# Patient Record
Sex: Female | Born: 1985
Health system: Southern US, Community
[De-identification: ages and names within clinical notes are randomized; demographics above are authoritative.]

## PROBLEM LIST (undated history)

## (undated) ENCOUNTER — Inpatient Hospital Stay (HOSPITAL_COMMUNITY): Payer: Self-pay

## (undated) DIAGNOSIS — Z789 Other specified health status: Secondary | ICD-10-CM

## (undated) DIAGNOSIS — IMO0002 Reserved for concepts with insufficient information to code with codable children: Secondary | ICD-10-CM

## (undated) DIAGNOSIS — Z87448 Personal history of other diseases of urinary system: Secondary | ICD-10-CM

## (undated) DIAGNOSIS — R87629 Unspecified abnormal cytological findings in specimens from vagina: Secondary | ICD-10-CM

## (undated) DIAGNOSIS — R87619 Unspecified abnormal cytological findings in specimens from cervix uteri: Secondary | ICD-10-CM

## (undated) DIAGNOSIS — Z8619 Personal history of other infectious and parasitic diseases: Secondary | ICD-10-CM

## (undated) DIAGNOSIS — D649 Anemia, unspecified: Secondary | ICD-10-CM

## (undated) HISTORY — DX: Personal history of other infectious and parasitic diseases: Z86.19

## (undated) HISTORY — DX: Anemia, unspecified: D64.9

## (undated) HISTORY — PX: NO PAST SURGERIES: SHX2092

## (undated) HISTORY — DX: Personal history of other diseases of urinary system: Z87.448

---

## 2010-12-04 DIAGNOSIS — D649 Anemia, unspecified: Secondary | ICD-10-CM

## 2010-12-04 HISTORY — DX: Anemia, unspecified: D64.9

## 2012-04-22 ENCOUNTER — Inpatient Hospital Stay (HOSPITAL_COMMUNITY)
Admission: AD | Admit: 2012-04-22 | Discharge: 2012-04-22 | Disposition: A | Discharge status: Home / Self Care | Payer: BC Managed Care – PPO | Source: Ambulatory Visit | Attending: Obstetrics and Gynecology | Admitting: Obstetrics and Gynecology

## 2012-04-22 ENCOUNTER — Inpatient Hospital Stay (HOSPITAL_COMMUNITY): Payer: BC Managed Care – PPO

## 2012-04-22 ENCOUNTER — Encounter (HOSPITAL_COMMUNITY): Payer: Self-pay | Admitting: *Deleted

## 2012-04-22 DIAGNOSIS — O200 Threatened abortion: Secondary | ICD-10-CM | POA: Insufficient documentation

## 2012-04-22 HISTORY — DX: Other specified health status: Z78.9

## 2012-04-22 LAB — CBC
MCH: 30.1 pg (ref 26.0–34.0)
Platelets: 196 10*3/uL (ref 150–400)
RBC: 4.52 MIL/uL (ref 3.87–5.11)
WBC: 8.8 10*3/uL (ref 4.0–10.5)

## 2012-04-22 LAB — ABO/RH: ABO/RH(D): O POS

## 2012-04-22 NOTE — MAU Note (Signed)
Pt had U/S on Friday told she was either only [redacted] weeks pregnant (supposed to be 7 weeks) or that  She may be miscarring or have an ectopic pregnancy. Pt stated she started spotting yesterday and the last night had heavier bleeding like a period passed some clots mild cramping reported.

## 2012-04-22 NOTE — MAU Provider Note (Signed)
History     CSN: [id number]  Arrival date and time: 04/22/12 1123   First Provider Initiated Contact with Patient 04/22/12 1234      Chief Complaint  Patient presents with  . Vaginal Bleeding   HPI  Pt is early pregnant ?5-7 weeks presenting with vaginal bleeding.  Pt denies pain.  Pt was seen on Friday 1/17 at Dr. Lisbeth Ply office and told that ultrasound showed pregnancy less than expected- she was measuring ~ 5 weeks with hx of long cycles.  Pt started bleeding last night like a period and passed some small clots.  Pt denies any pain or other complaints, except feeling dizzy and light headed.  Pt is to have repeat HCG in the office tomorrow  Past Medical History  Diagnosis Date  . No pertinent past medical history     Past Surgical History  Procedure Date  . No past surgeries     No family history on file.  History  Substance Use Topics  . Smoking status: Never Smoker   . Smokeless tobacco: Not on file  . Alcohol Use: Yes     Comment: before pregnancy    Allergies: No Known Allergies  Prescriptions prior to admission  Medication Sig Dispense Refill  . Prenatal Vit-Fe Fumarate-FA (PRENATAL MULTIVITAMIN) TABS Take 1 tablet by mouth daily.        Review of Systems  Constitutional: Negative for fever and chills.  Gastrointestinal: Negative for nausea, vomiting, abdominal pain, diarrhea and constipation.  Genitourinary: Negative for dysuria.  Skin: Negative for rash.  Neurological: Positive for dizziness. Negative for headaches.   Physical Exam   Blood pressure 115/69, pulse 80, temperature 98.1 F (36.7 C), temperature source Oral, resp. rate 18, height 5\' 1"  (1.549 m), weight 114 lb (51.71 kg), last menstrual period 02/25/2012.  Physical Exam  Vitals reviewed. Constitutional: She is oriented to person, place, and time. She appears well-developed and well-nourished.  HENT:  Head: Normocephalic.  Eyes: Pupils are equal, round, and reactive to light.  Neck:  Normal range of motion. Neck supple.  Cardiovascular: Normal rate.   Respiratory: Effort normal.  GI: Soft. She exhibits no distension. There is no tenderness. There is no rebound.  Musculoskeletal: Normal range of motion.  Neurological: She is alert and oriented to person, place, and time.  Skin: Skin is warm and dry.  Psychiatric: She has a normal mood and affect.    MAU Course  Procedures Results for orders placed during the hospital encounter of 04/22/12 (from the past 24 hour(s))  CBC     Status: Normal   Collection Time   04/22/12 11:35 AM      Component Value Range   WBC 8.8  4.0 - 10.5 K/uL   RBC 4.52  3.87 - 5.11 MIL/uL   Hemoglobin 13.6  12.0 - 15.0 g/dL   HCT 40.9  81.1 - 91.4 %   MCV 90.0  78.0 - 100.0 fL   MCH 30.1  26.0 - 34.0 pg   MCHC 33.4  30.0 - 36.0 g/dL   RDW 78.2  95.6 - 21.3 %   Platelets 196  150 - 400 K/uL  ABO/RH     Status: Normal   Collection Time   04/22/12 11:35 AM      Component Value Range   ABO/RH(D) O POS    HCG, QUANTITATIVE, PREGNANCY     Status: Abnormal   Collection Time   04/22/12 11:35 AM      Component Value Range  hCG, Beta Chain, Quant, S 624 (*) <5 mIU/mL     Discussed with Dr. Marcelle Overlie- probable miscarriage- will f/u in the office tomorrow to see Dr. Arelia Sneddon Discussed with pt and husband threatened miscarriage Assessment and Plan  Bleeding in pregnancy Threatened miscarriage  Dalbert Stillings 04/22/2012, 1:39 PM

## 2012-08-15 LAB — OB RESULTS CONSOLE ABO/RH

## 2012-08-15 LAB — OB RESULTS CONSOLE GC/CHLAMYDIA: Gonorrhea: NEGATIVE

## 2012-08-15 LAB — OB RESULTS CONSOLE ANTIBODY SCREEN: Antibody Screen: NEGATIVE

## 2013-02-07 LAB — OB RESULTS CONSOLE GBS: GBS: NEGATIVE

## 2013-02-13 ENCOUNTER — Encounter (HOSPITAL_COMMUNITY): Payer: Self-pay | Admitting: *Deleted

## 2013-02-13 ENCOUNTER — Telehealth (HOSPITAL_COMMUNITY): Payer: Self-pay | Admitting: *Deleted

## 2013-02-13 ENCOUNTER — Inpatient Hospital Stay (HOSPITAL_COMMUNITY)
Admission: AD | Admit: 2013-02-13 | Discharge: 2013-02-13 | Disposition: A | Discharge status: Home / Self Care | Payer: BC Managed Care – PPO | Source: Ambulatory Visit | Attending: Obstetrics and Gynecology | Admitting: Obstetrics and Gynecology

## 2013-02-13 DIAGNOSIS — O321XX0 Maternal care for breech presentation, not applicable or unspecified: Secondary | ICD-10-CM | POA: Insufficient documentation

## 2013-02-13 DIAGNOSIS — N949 Unspecified condition associated with female genital organs and menstrual cycle: Secondary | ICD-10-CM | POA: Insufficient documentation

## 2013-02-13 DIAGNOSIS — O99891 Other specified diseases and conditions complicating pregnancy: Secondary | ICD-10-CM | POA: Insufficient documentation

## 2013-02-13 HISTORY — DX: Other specified health status: Z78.9

## 2013-02-13 LAB — URINALYSIS, ROUTINE W REFLEX MICROSCOPIC
Bilirubin Urine: NEGATIVE
Protein, ur: NEGATIVE mg/dL
Urobilinogen, UA: 0.2 mg/dL (ref 0.0–1.0)

## 2013-02-13 LAB — URINE MICROSCOPIC-ADD ON

## 2013-02-13 NOTE — MAU Note (Signed)
Pt reports brownish discharge since yesterday am, pelvic pain / pressure this am. . Reports baby is breech.

## 2013-02-13 NOTE — Telephone Encounter (Signed)
Preadmission screen  

## 2013-02-13 NOTE — MAU Note (Signed)
Dr. Marcelle Overlie notified of pt, orders rec'd.

## 2013-02-14 LAB — URINE CULTURE: Culture: NO GROWTH

## 2013-02-22 ENCOUNTER — Observation Stay (HOSPITAL_COMMUNITY)
Admission: RE | Admit: 2013-02-22 | Discharge: 2013-02-22 | Disposition: A | Discharge status: Home / Self Care | Payer: BC Managed Care – PPO | Source: Ambulatory Visit | Attending: Obstetrics & Gynecology | Admitting: Obstetrics & Gynecology

## 2013-02-22 ENCOUNTER — Encounter (HOSPITAL_COMMUNITY): Payer: Self-pay

## 2013-02-22 DIAGNOSIS — O321XX0 Maternal care for breech presentation, not applicable or unspecified: Principal | ICD-10-CM | POA: Insufficient documentation

## 2013-02-22 MED ORDER — TERBUTALINE SULFATE 1 MG/ML IJ SOLN
0.2500 mg | Freq: Once | INTRAMUSCULAR | Status: AC
  Administered 2013-02-22: 0.25 mg via SUBCUTANEOUS
  Filled 2013-02-22: qty 1

## 2013-02-22 MED ORDER — LACTATED RINGERS IV SOLN: INTRAVENOUS | Status: DC

## 2013-02-22 NOTE — H&P (Signed)
Jo Arnold is a 27 y.o. female presenting for ECV for breech presentation.  The patient has had an uncomplicated pregnancy.  First tri screen wnl and GBS negative.  Blood type O positive.  She has a posterior placenta and normal AFI.  She has been counseled thoroughly in the office for primary c/s vs ECV and understands the r/b of each.  She elects to proceed with ECV.  Maternal Medical History:  Fetal activity: Perceived fetal activity is normal.   Last perceived fetal movement was within the past hour.    Prenatal complications: no prenatal complications Prenatal Complications - Diabetes: none.    OB History   Grav Para Term Preterm Abortions TAB SAB Ect Mult Living   2    1  1         Past Medical History  Diagnosis Date  . No pertinent past medical history   . Medical history non-contributory   . Hx of varicella   . Hx of pyelonephritis    Past Surgical History  Procedure Laterality Date  . No past surgeries     Family History: family history includes Heart disease in her brother and father; Hypertension in her mother. Social History:  reports that she has never smoked. She does not have any smokeless tobacco history on file. She reports that she drinks alcohol. She reports that she does not use illicit drugs.   Prenatal Transfer Tool  Maternal Diabetes: No Genetic Screening: Normal Maternal Ultrasounds/Referrals: Normal Fetal Ultrasounds or other Referrals:  None Maternal Substance Abuse:  No Significant Maternal Medications:  None Significant Maternal Lab Results:  Lab values include: Group B Strep negative Other Comments:  None  ROS    Last menstrual period 02/25/2012, unknown if currently breastfeeding. Maternal Exam:  Uterine Assessment: Contraction strength is mild.  Contraction frequency is rare.   Abdomen: Patient reports no abdominal tenderness. Fundal height is c/w dates.   Estimated fetal weight is 6#.   Fetal presentation: breech     Physical  Exam  Constitutional: She is oriented to person, place, and time. She appears well-developed and well-nourished.  GI: Soft. There is no rebound and no guarding.  Neurological: She is alert and oriented to person, place, and time.  Skin: Skin is warm and dry.  Psychiatric: She has a normal mood and affect. Her behavior is normal.    Prenatal labs: ABO, Rh: O/Positive/-- (05/14 0000) Antibody: Negative (05/14 0000) Rubella: Immune (05/14 0000) RPR: Nonreactive (05/14 0000)  HBsAg: Negative (05/14 0000)  HIV: Non-reactive (05/14 0000)  GBS:     Assessment/Plan: 27yo G2P0 at [redacted]w[redacted]d for ECV -Will place IV and give terb -Monitor pre- and post-procedure    Sharni Negron 02/22/2013, 7:35 AM

## 2013-02-22 NOTE — Progress Notes (Signed)
External Cephalic Version  Pt was confirmed breech by ultrasound today.  She desires attempt at ECV.  Risks and benefits have been discussed at length and informed consent was obtained. EFM with reactive tracing and patient was given terbutaline .25mg SQ.  IV had been placed and Ultrasound at bedside.  Fetal vertex in RUQ was confirmed and fetal buttocks was elevated out of the pelvis.  In a counterclockwise fashion, ECV was carried out with successful placement of fetal vertex in the cephalic position confirmed by ultrasound.  Pt and fetus tolerated the procedure well.  EFM was reassuring immediately after the procedure and the patient was excited and reassured by the procedure. Plan to monitor EFM for 1 hour and if remains reassuring will discharge the patient home with routine labor warnings and kickcounts.  Follow up in the office as scheduled next week.  

## 2013-03-09 ENCOUNTER — Inpatient Hospital Stay (HOSPITAL_COMMUNITY)
Admission: AD | Admit: 2013-03-09 | Discharge: 2013-03-12 | DRG: 765 | Disposition: A | Discharge status: Home / Self Care | Payer: BC Managed Care – PPO | Source: Ambulatory Visit | Attending: Obstetrics and Gynecology | Admitting: Obstetrics and Gynecology

## 2013-03-09 ENCOUNTER — Encounter (HOSPITAL_COMMUNITY): Payer: BC Managed Care – PPO | Admitting: Anesthesiology

## 2013-03-09 ENCOUNTER — Encounter (HOSPITAL_COMMUNITY): Payer: Self-pay | Admitting: *Deleted

## 2013-03-09 ENCOUNTER — Encounter (HOSPITAL_COMMUNITY)
Admission: AD | Disposition: A | Discharge status: Home / Self Care | Payer: Self-pay | Source: Ambulatory Visit | Attending: Obstetrics and Gynecology

## 2013-03-09 ENCOUNTER — Inpatient Hospital Stay (HOSPITAL_COMMUNITY): Payer: BC Managed Care – PPO | Admitting: Anesthesiology

## 2013-03-09 DIAGNOSIS — Z98891 History of uterine scar from previous surgery: Secondary | ICD-10-CM

## 2013-03-09 DIAGNOSIS — O411090 Infection of amniotic sac and membranes, unspecified, unspecified trimester, not applicable or unspecified: Secondary | ICD-10-CM | POA: Diagnosis present

## 2013-03-09 HISTORY — DX: Unspecified abnormal cytological findings in specimens from cervix uteri: R87.619

## 2013-03-09 HISTORY — DX: Reserved for concepts with insufficient information to code with codable children: IMO0002

## 2013-03-09 LAB — URINALYSIS, ROUTINE W REFLEX MICROSCOPIC
Leukocytes, UA: NEGATIVE
Protein, ur: NEGATIVE mg/dL
Urobilinogen, UA: 0.2 mg/dL (ref 0.0–1.0)

## 2013-03-09 LAB — CBC
MCH: 28.4 pg (ref 26.0–34.0)
MCHC: 33.8 g/dL (ref 30.0–36.0)
Platelets: 197 10*3/uL (ref 150–400)

## 2013-03-09 LAB — RPR: RPR Ser Ql: NONREACTIVE

## 2013-03-09 SURGERY — Surgical Case
Anesthesia: Epidural | Site: Abdomen

## 2013-03-09 MED ORDER — NALOXONE HCL 0.4 MG/ML IJ SOLN: 0.4000 mg | INTRAMUSCULAR | Status: DC | PRN

## 2013-03-09 MED ORDER — MORPHINE SULFATE (PF) 0.5 MG/ML IJ SOLN
INTRAMUSCULAR | Status: DC | PRN
  Administered 2013-03-09: 4 mg via EPIDURAL

## 2013-03-09 MED ORDER — LANOLIN HYDROUS EX OINT: 1.0000 "application " | TOPICAL_OINTMENT | CUTANEOUS | Status: DC | PRN

## 2013-03-09 MED ORDER — MEPERIDINE HCL 25 MG/ML IJ SOLN: 6.2500 mg | INTRAMUSCULAR | Status: DC | PRN

## 2013-03-09 MED ORDER — ONDANSETRON HCL 4 MG/2ML IJ SOLN: 4.0000 mg | INTRAMUSCULAR | Status: DC | PRN

## 2013-03-09 MED ORDER — OXYTOCIN 40 UNITS IN LACTATED RINGERS INFUSION - SIMPLE MED: 62.5000 mL/h | INTRAVENOUS | Status: AC

## 2013-03-09 MED ORDER — FENTANYL CITRATE 0.05 MG/ML IJ SOLN
INTRAMUSCULAR | Status: AC
  Filled 2013-03-09: qty 2

## 2013-03-09 MED ORDER — DIPHENHYDRAMINE HCL 50 MG/ML IJ SOLN: 25.0000 mg | INTRAMUSCULAR | Status: DC | PRN

## 2013-03-09 MED ORDER — METOCLOPRAMIDE HCL 5 MG/ML IJ SOLN
INTRAMUSCULAR | Status: AC
  Filled 2013-03-09: qty 2

## 2013-03-09 MED ORDER — DIPHENHYDRAMINE HCL 50 MG/ML IJ SOLN: 12.5000 mg | INTRAMUSCULAR | Status: DC | PRN

## 2013-03-09 MED ORDER — ZOLPIDEM TARTRATE 5 MG PO TABS: 5.0000 mg | ORAL_TABLET | Freq: Every evening | ORAL | Status: DC | PRN

## 2013-03-09 MED ORDER — NALBUPHINE HCL 10 MG/ML IJ SOLN
5.0000 mg | INTRAMUSCULAR | Status: DC | PRN
  Filled 2013-03-09: qty 1

## 2013-03-09 MED ORDER — OXYTOCIN 10 UNIT/ML IJ SOLN
INTRAMUSCULAR | Status: AC
  Filled 2013-03-09: qty 4

## 2013-03-09 MED ORDER — SENNOSIDES-DOCUSATE SODIUM 8.6-50 MG PO TABS
2.0000 | ORAL_TABLET | ORAL | Status: DC
  Administered 2013-03-11 – 2013-03-12 (×2): 2 via ORAL
  Filled 2013-03-09 (×2): qty 2

## 2013-03-09 MED ORDER — LACTATED RINGERS IV SOLN
INTRAVENOUS | Status: DC
  Administered 2013-03-10: 06:00:00 via INTRAVENOUS

## 2013-03-09 MED ORDER — FENTANYL 2.5 MCG/ML BUPIVACAINE 1/10 % EPIDURAL INFUSION (WH - ANES)
14.0000 mL/h | INTRAMUSCULAR | Status: DC | PRN
  Administered 2013-03-09: 14 mL/h via EPIDURAL
  Filled 2013-03-09 (×2): qty 125

## 2013-03-09 MED ORDER — NALOXONE HCL 1 MG/ML IJ SOLN
1.0000 ug/kg/h | INTRAVENOUS | Status: DC | PRN
  Filled 2013-03-09: qty 2

## 2013-03-09 MED ORDER — CITRIC ACID-SODIUM CITRATE 334-500 MG/5ML PO SOLN
30.0000 mL | ORAL | Status: DC | PRN
  Administered 2013-03-09: 30 mL via ORAL
  Filled 2013-03-09: qty 15

## 2013-03-09 MED ORDER — METOCLOPRAMIDE HCL 5 MG/ML IJ SOLN
10.0000 mg | Freq: Once | INTRAMUSCULAR | Status: AC | PRN
  Administered 2013-03-09: 10 mg via INTRAVENOUS

## 2013-03-09 MED ORDER — SODIUM BICARBONATE 8.4 % IV SOLN
INTRAVENOUS | Status: DC | PRN
  Administered 2013-03-09 (×2): 5 mL via EPIDURAL

## 2013-03-09 MED ORDER — EPHEDRINE 5 MG/ML INJ: 10.0000 mg | INTRAVENOUS | Status: DC | PRN

## 2013-03-09 MED ORDER — ONDANSETRON HCL 4 MG/2ML IJ SOLN: 4.0000 mg | Freq: Three times a day (TID) | INTRAMUSCULAR | Status: DC | PRN

## 2013-03-09 MED ORDER — SODIUM BICARBONATE 8.4 % IV SOLN
INTRAVENOUS | Status: AC
  Filled 2013-03-09: qty 50

## 2013-03-09 MED ORDER — CEFAZOLIN SODIUM-DEXTROSE 2-3 GM-% IV SOLR
INTRAVENOUS | Status: AC
  Filled 2013-03-09: qty 50

## 2013-03-09 MED ORDER — METOCLOPRAMIDE HCL 5 MG/ML IJ SOLN: 10.0000 mg | Freq: Three times a day (TID) | INTRAMUSCULAR | Status: DC | PRN

## 2013-03-09 MED ORDER — LACTATED RINGERS IV SOLN
INTRAVENOUS | Status: DC
  Administered 2013-03-09: 125 mL/h via INTRAVENOUS
  Administered 2013-03-09 (×2): via INTRAVENOUS

## 2013-03-09 MED ORDER — LIDOCAINE HCL (PF) 1 % IJ SOLN
INTRAMUSCULAR | Status: DC | PRN
  Administered 2013-03-09 (×2): 4 mL

## 2013-03-09 MED ORDER — LACTATED RINGERS IV SOLN
INTRAVENOUS | Status: DC | PRN
  Administered 2013-03-09 (×2): via INTRAVENOUS

## 2013-03-09 MED ORDER — LACTATED RINGERS IV SOLN: 500.0000 mL | INTRAVENOUS | Status: DC | PRN

## 2013-03-09 MED ORDER — SIMETHICONE 80 MG PO CHEW: 80.0000 mg | CHEWABLE_TABLET | ORAL | Status: DC | PRN

## 2013-03-09 MED ORDER — IBUPROFEN 600 MG PO TABS
600.0000 mg | ORAL_TABLET | Freq: Four times a day (QID) | ORAL | Status: DC
  Administered 2013-03-10 – 2013-03-12 (×11): 600 mg via ORAL
  Filled 2013-03-09 (×11): qty 1

## 2013-03-09 MED ORDER — LIDOCAINE-EPINEPHRINE (PF) 2 %-1:200000 IJ SOLN
INTRAMUSCULAR | Status: AC
  Filled 2013-03-09: qty 20

## 2013-03-09 MED ORDER — OXYCODONE-ACETAMINOPHEN 5-325 MG PO TABS: 1.0000 | ORAL_TABLET | ORAL | Status: DC | PRN

## 2013-03-09 MED ORDER — WITCH HAZEL-GLYCERIN EX PADS
1.0000 "application " | MEDICATED_PAD | CUTANEOUS | Status: DC | PRN
  Administered 2013-03-11: 1 via TOPICAL

## 2013-03-09 MED ORDER — KETOROLAC TROMETHAMINE 30 MG/ML IJ SOLN: 30.0000 mg | Freq: Four times a day (QID) | INTRAMUSCULAR | Status: AC | PRN

## 2013-03-09 MED ORDER — PHENYLEPHRINE 40 MCG/ML (10ML) SYRINGE FOR IV PUSH (FOR BLOOD PRESSURE SUPPORT): 80.0000 ug | PREFILLED_SYRINGE | INTRAVENOUS | Status: DC | PRN

## 2013-03-09 MED ORDER — DEXAMETHASONE SODIUM PHOSPHATE 10 MG/ML IJ SOLN
INTRAMUSCULAR | Status: DC | PRN
  Administered 2013-03-09: 10 mg via INTRAVENOUS

## 2013-03-09 MED ORDER — TETANUS-DIPHTH-ACELL PERTUSSIS 5-2.5-18.5 LF-MCG/0.5 IM SUSP: 0.5000 mL | Freq: Once | INTRAMUSCULAR | Status: DC

## 2013-03-09 MED ORDER — FENTANYL 2.5 MCG/ML BUPIVACAINE 1/10 % EPIDURAL INFUSION (WH - ANES)
INTRAMUSCULAR | Status: DC | PRN
  Administered 2013-03-09: 12.5 mL/h via EPIDURAL

## 2013-03-09 MED ORDER — PRENATAL MULTIVITAMIN CH
1.0000 | ORAL_TABLET | Freq: Every day | ORAL | Status: DC
  Administered 2013-03-10 – 2013-03-12 (×3): 1 via ORAL
  Filled 2013-03-09 (×3): qty 1

## 2013-03-09 MED ORDER — LACTATED RINGERS IV SOLN
INTRAVENOUS | Status: DC | PRN
  Administered 2013-03-09: 21:00:00 via INTRAVENOUS

## 2013-03-09 MED ORDER — SODIUM CHLORIDE 0.9 % IJ SOLN: 3.0000 mL | INTRAMUSCULAR | Status: DC | PRN

## 2013-03-09 MED ORDER — IBUPROFEN 600 MG PO TABS: 600.0000 mg | ORAL_TABLET | Freq: Four times a day (QID) | ORAL | Status: DC | PRN

## 2013-03-09 MED ORDER — SCOPOLAMINE 1 MG/3DAYS TD PT72
MEDICATED_PATCH | TRANSDERMAL | Status: AC
  Filled 2013-03-09: qty 1

## 2013-03-09 MED ORDER — ONDANSETRON HCL 4 MG/2ML IJ SOLN
INTRAMUSCULAR | Status: AC
  Filled 2013-03-09: qty 2

## 2013-03-09 MED ORDER — DIPHENHYDRAMINE HCL 25 MG PO CAPS: 25.0000 mg | ORAL_CAPSULE | Freq: Four times a day (QID) | ORAL | Status: DC | PRN

## 2013-03-09 MED ORDER — OXYTOCIN 40 UNITS IN LACTATED RINGERS INFUSION - SIMPLE MED
1.0000 m[IU]/min | INTRAVENOUS | Status: DC
  Administered 2013-03-09: 2 m[IU]/min via INTRAVENOUS
  Filled 2013-03-09: qty 1000

## 2013-03-09 MED ORDER — EPHEDRINE 5 MG/ML INJ
10.0000 mg | INTRAVENOUS | Status: DC | PRN
  Filled 2013-03-09: qty 4

## 2013-03-09 MED ORDER — LACTATED RINGERS IV SOLN: 500.0000 mL | Freq: Once | INTRAVENOUS | Status: DC

## 2013-03-09 MED ORDER — ONDANSETRON HCL 4 MG PO TABS: 4.0000 mg | ORAL_TABLET | ORAL | Status: DC | PRN

## 2013-03-09 MED ORDER — MEPERIDINE HCL 25 MG/ML IJ SOLN
INTRAMUSCULAR | Status: DC | PRN
  Administered 2013-03-09 (×2): 12.5 mg via INTRAVENOUS

## 2013-03-09 MED ORDER — ONDANSETRON HCL 4 MG/2ML IJ SOLN: 4.0000 mg | Freq: Four times a day (QID) | INTRAMUSCULAR | Status: DC | PRN

## 2013-03-09 MED ORDER — FENTANYL CITRATE 0.05 MG/ML IJ SOLN
25.0000 ug | INTRAMUSCULAR | Status: DC | PRN
  Administered 2013-03-09: 50 ug via INTRAVENOUS

## 2013-03-09 MED ORDER — MENTHOL 3 MG MT LOZG: 1.0000 | LOZENGE | OROMUCOSAL | Status: DC | PRN

## 2013-03-09 MED ORDER — ACETAMINOPHEN 325 MG PO TABS: 650.0000 mg | ORAL_TABLET | ORAL | Status: DC | PRN

## 2013-03-09 MED ORDER — SIMETHICONE 80 MG PO CHEW
80.0000 mg | CHEWABLE_TABLET | ORAL | Status: DC
  Administered 2013-03-11 – 2013-03-12 (×2): 80 mg via ORAL
  Filled 2013-03-09 (×2): qty 1

## 2013-03-09 MED ORDER — OXYCODONE-ACETAMINOPHEN 5-325 MG PO TABS
1.0000 | ORAL_TABLET | ORAL | Status: DC | PRN
  Administered 2013-03-10 – 2013-03-11 (×2): 1 via ORAL
  Administered 2013-03-11 – 2013-03-12 (×2): 2 via ORAL
  Filled 2013-03-09: qty 2
  Filled 2013-03-09 (×2): qty 1
  Filled 2013-03-09: qty 2

## 2013-03-09 MED ORDER — LIDOCAINE HCL (PF) 1 % IJ SOLN: 30.0000 mL | INTRAMUSCULAR | Status: DC | PRN

## 2013-03-09 MED ORDER — MORPHINE SULFATE (PF) 0.5 MG/ML IJ SOLN
INTRAMUSCULAR | Status: DC | PRN
  Administered 2013-03-09: 1 mg via INTRAVENOUS

## 2013-03-09 MED ORDER — MEPERIDINE HCL 25 MG/ML IJ SOLN
INTRAMUSCULAR | Status: AC
  Filled 2013-03-09: qty 1

## 2013-03-09 MED ORDER — DEXAMETHASONE SODIUM PHOSPHATE 10 MG/ML IJ SOLN
INTRAMUSCULAR | Status: AC
  Filled 2013-03-09: qty 1

## 2013-03-09 MED ORDER — DIBUCAINE 1 % RE OINT
1.0000 "application " | TOPICAL_OINTMENT | RECTAL | Status: DC | PRN
  Administered 2013-03-11 – 2013-03-12 (×2): 1 via RECTAL
  Filled 2013-03-09 (×2): qty 28

## 2013-03-09 MED ORDER — DIPHENHYDRAMINE HCL 25 MG PO CAPS: 25.0000 mg | ORAL_CAPSULE | ORAL | Status: DC | PRN

## 2013-03-09 MED ORDER — KETOROLAC TROMETHAMINE 60 MG/2ML IM SOLN
INTRAMUSCULAR | Status: AC
  Filled 2013-03-09: qty 2

## 2013-03-09 MED ORDER — ONDANSETRON HCL 4 MG/2ML IJ SOLN
INTRAMUSCULAR | Status: DC | PRN
  Administered 2013-03-09: 4 mg via INTRAVENOUS

## 2013-03-09 MED ORDER — PHENYLEPHRINE 40 MCG/ML (10ML) SYRINGE FOR IV PUSH (FOR BLOOD PRESSURE SUPPORT)
80.0000 ug | PREFILLED_SYRINGE | INTRAVENOUS | Status: DC | PRN
  Filled 2013-03-09: qty 10

## 2013-03-09 MED ORDER — OXYTOCIN BOLUS FROM INFUSION: 500.0000 mL | INTRAVENOUS | Status: DC

## 2013-03-09 MED ORDER — FLEET ENEMA 7-19 GM/118ML RE ENEM: 1.0000 | ENEMA | Freq: Once | RECTAL | Status: DC

## 2013-03-09 MED ORDER — MORPHINE SULFATE 0.5 MG/ML IJ SOLN
INTRAMUSCULAR | Status: AC
  Filled 2013-03-09: qty 10

## 2013-03-09 MED ORDER — SCOPOLAMINE 1 MG/3DAYS TD PT72
1.0000 | MEDICATED_PATCH | Freq: Once | TRANSDERMAL | Status: DC
  Administered 2013-03-09: 1.5 mg via TRANSDERMAL

## 2013-03-09 MED ORDER — SIMETHICONE 80 MG PO CHEW
80.0000 mg | CHEWABLE_TABLET | Freq: Three times a day (TID) | ORAL | Status: DC
  Administered 2013-03-10 – 2013-03-12 (×8): 80 mg via ORAL
  Filled 2013-03-09 (×8): qty 1

## 2013-03-09 MED ORDER — KETOROLAC TROMETHAMINE 60 MG/2ML IM SOLN
60.0000 mg | Freq: Once | INTRAMUSCULAR | Status: AC | PRN
  Administered 2013-03-09: 60 mg via INTRAMUSCULAR

## 2013-03-09 MED ORDER — TERBUTALINE SULFATE 1 MG/ML IJ SOLN: 0.2500 mg | Freq: Once | INTRAMUSCULAR | Status: DC | PRN

## 2013-03-09 MED ORDER — CEFAZOLIN SODIUM-DEXTROSE 2-3 GM-% IV SOLR
INTRAVENOUS | Status: DC | PRN
  Administered 2013-03-09: 2 g via INTRAVENOUS

## 2013-03-09 MED ORDER — OXYTOCIN 40 UNITS IN LACTATED RINGERS INFUSION - SIMPLE MED: 62.5000 mL/h | INTRAVENOUS | Status: DC

## 2013-03-09 SURGICAL SUPPLY — 32 items
BARRIER ADHS 3X4 INTERCEED (GAUZE/BANDAGES/DRESSINGS) IMPLANT
CLAMP CORD UMBIL (MISCELLANEOUS) ×2 IMPLANT
CLOTH BEACON ORANGE TIMEOUT ST (SAFETY) ×2 IMPLANT
CONTAINER PREFILL 10% NBF 15ML (MISCELLANEOUS) IMPLANT
DERMABOND ADVANCED (GAUZE/BANDAGES/DRESSINGS) ×1
DERMABOND ADVANCED .7 DNX12 (GAUZE/BANDAGES/DRESSINGS) ×1 IMPLANT
DRAPE LG THREE QUARTER DISP (DRAPES) ×2 IMPLANT
DRSG OPSITE POSTOP 4X10 (GAUZE/BANDAGES/DRESSINGS) ×2 IMPLANT
DURAPREP 26ML APPLICATOR (WOUND CARE) ×2 IMPLANT
ELECT REM PT RETURN 9FT ADLT (ELECTROSURGICAL) ×2
ELECTRODE REM PT RTRN 9FT ADLT (ELECTROSURGICAL) ×1 IMPLANT
EXTRACTOR VACUUM M CUP 4 TUBE (SUCTIONS) ×2 IMPLANT
GLOVE BIO SURGEON STRL SZ 6.5 (GLOVE) ×2 IMPLANT
GOWN PREVENTION PLUS XLARGE (GOWN DISPOSABLE) ×4 IMPLANT
GOWN STRL REIN XL XLG (GOWN DISPOSABLE) ×4 IMPLANT
KIT ABG SYR 3ML LUER SLIP (SYRINGE) IMPLANT
NEEDLE HYPO 22GX1.5 SAFETY (NEEDLE) ×2 IMPLANT
NEEDLE HYPO 25X5/8 SAFETYGLIDE (NEEDLE) ×2 IMPLANT
NS IRRIG 1000ML POUR BTL (IV SOLUTION) ×2 IMPLANT
PACK C SECTION WH (CUSTOM PROCEDURE TRAY) ×2 IMPLANT
PAD OB MATERNITY 4.3X12.25 (PERSONAL CARE ITEMS) ×2 IMPLANT
STAPLER VISISTAT 35W (STAPLE) IMPLANT
SUT CHROMIC 0 CTX 36 (SUTURE) ×4 IMPLANT
SUT PLAIN 0 NONE (SUTURE) IMPLANT
SUT PLAIN 2 0 XLH (SUTURE) ×2 IMPLANT
SUT VIC AB 0 CT1 27 (SUTURE) ×3
SUT VIC AB 0 CT1 27XBRD ANBCTR (SUTURE) ×3 IMPLANT
SUT VIC AB 4-0 KS 27 (SUTURE) ×2 IMPLANT
SYR CONTROL 10ML LL (SYRINGE) ×2 IMPLANT
TOWEL OR 17X24 6PK STRL BLUE (TOWEL DISPOSABLE) ×2 IMPLANT
TRAY FOLEY CATH 14FR (SET/KITS/TRAYS/PACK) ×2 IMPLANT
WATER STERILE IRR 1000ML POUR (IV SOLUTION) ×2 IMPLANT

## 2013-03-09 NOTE — H&P (Signed)
Jo Arnold is a 27 y.o. female presenting for labor. Maternal Medical History:  Reason for admission: Contractions.   Contractions: Frequency: regular.   Perceived severity is moderate.    Fetal activity: Perceived fetal activity is normal.    Prenatal complications: no prenatal complications   OB History   Grav Para Term Preterm Abortions TAB SAB Ect Mult Living   2    1  1         Past Medical History  Diagnosis Date  . No pertinent past medical history   . Hx of varicella   . Hx of pyelonephritis   . Abnormal Pap smear   . Medical history non-contributory    Past Surgical History  Procedure Laterality Date  . No past surgeries     Family History: family history includes Heart disease in her brother and father; Hypertension in her mother. Social History:  reports that she has never smoked. She has never used smokeless tobacco. She reports that she drinks alcohol. She reports that she does not use illicit drugs.   Prenatal Transfer Tool  Maternal Diabetes: No Genetic Screening: Normal Maternal Ultrasounds/Referrals: Normal Fetal Ultrasounds or other Referrals:  None Maternal Substance Abuse:  No Significant Maternal Medications:  None Significant Maternal Lab Results:  None Other Comments:  None  Review of Systems  All other systems reviewed and are negative.    Dilation: 4 Effacement (%): 80 Station: -2 Exam by:: Dellie Burns, RN BSN Blood pressure 109/70, pulse 73, temperature 97.5 F (36.4 C), temperature source Oral, resp. rate 18, height 5' 0.5" (1.537 m), weight 61.689 kg (136 lb), last menstrual period 02/25/2012, SpO2 98.00%. Maternal Exam:  Uterine Assessment: Contraction strength is moderate.  Contraction frequency is regular.   Abdomen: Fetal presentation: vertex  Introitus: Normal vulva.   Fetal Exam Fetal Monitor Review: Mode: fetal scalp electrode.   Variability: minimal (<5 bpm).   Pattern: accelerations present.        Physical Exam  Nursing note and vitals reviewed. Constitutional: She appears well-developed.  HENT:  Head: Normocephalic.  Eyes: Conjunctivae are normal. Pupils are equal, round, and reactive to light.  Neck: Normal range of motion.  Cardiovascular: Normal rate, regular rhythm and normal heart sounds.   Respiratory: Effort normal.  GI: Soft.    Prenatal labs: ABO, Rh: O/Positive/-- (05/14 0000) Antibody: Negative (05/14 0000) Rubella: Immune (05/14 0000) RPR: NON REACTIVE (12/06 1015)  HBsAg: Negative (05/14 0000)  HIV: Non-reactive (05/14 0000)  GBS: Negative (11/06 0000)   Assessment/Plan: IUP at term Follow Labor Curve  Dymond Spreen L 03/09/2013, 2:00 PM

## 2013-03-09 NOTE — Progress Notes (Signed)
Dr. Vincente Poli at the bedside and discussed risks and benefits of cesarean section delivery. Patient states understanding. Informed consents signed.

## 2013-03-09 NOTE — MAU Note (Signed)
Pt to go to room 167 per [facility], RN

## 2013-03-09 NOTE — Anesthesia Procedure Notes (Signed)
Epidural Patient location during procedure: OB Start time: 03/09/2013 11:37 AM  Staffing Anesthesiologist: Amar Keenum A. Performed by: anesthesiologist   Preanesthetic Checklist Completed: patient identified, site marked, surgical consent, pre-op evaluation, timeout performed, IV checked, risks and benefits discussed and monitors and equipment checked  Epidural Patient position: sitting Prep: site prepped and draped and DuraPrep Patient monitoring: continuous pulse ox and blood pressure Approach: midline Injection technique: LOR air  Needle:  Needle type: Tuohy  Needle gauge: 17 G Needle length: 9 cm and 9 Needle insertion depth: 4 cm Catheter type: closed end flexible Catheter size: 19 Gauge Catheter at skin depth: 9 cm Test dose: negative and Other  Assessment Events: blood not aspirated, injection not painful, no injection resistance, negative IV test and no paresthesia  Additional Notes Patient identified. Risks and benefits discussed including failed block, incomplete  Pain control, post dural puncture headache, nerve damage, paralysis, blood pressure Changes, nausea, vomiting, reactions to medications-both toxic and allergic and post Partum back pain. All questions were answered. Patient expressed understanding and wished to proceed. Sterile technique was used throughout procedure. Epidural site was Dressed with sterile barrier dressing. No paresthesias, signs of intravascular injection Or signs of intrathecal spread were encountered.  Patient was more comfortable after the epidural was dosed. Please see RN's note for documentation of vital signs and FHR which are stable.

## 2013-03-09 NOTE — MAU Note (Signed)
Patient presents with complaint of contractions since 0300 this morning.

## 2013-03-09 NOTE — Anesthesia Preprocedure Evaluation (Addendum)
Anesthesia Evaluation  Patient identified by MRN, date of birth, ID band Patient awake    Reviewed: Allergy & Precautions, H&P , Patient's Chart, lab work & pertinent test results  Airway Mallampati: III TM Distance: >3 FB Neck ROM: full    Dental no notable dental hx. (+) Teeth Intact   Pulmonary neg pulmonary ROS,  breath sounds clear to auscultation  Pulmonary exam normal       Cardiovascular negative cardio ROS  Rhythm:regular Rate:Normal     Neuro/Psych negative neurological ROS  negative psych ROS   GI/Hepatic negative GI ROS, Neg liver ROS,   Endo/Other  negative endocrine ROS  Renal/GU Hx/o Pyelonephritis  negative genitourinary   Musculoskeletal   Abdominal   Peds  Hematology negative hematology ROS (+)   Anesthesia Other Findings   Reproductive/Obstetrics (+) Pregnancy (non-reassuring --> C/S)                          Anesthesia Physical Anesthesia Plan  ASA: II and emergent  Anesthesia Plan: Epidural   Post-op Pain Management:    Induction:   Airway Management Planned:   Additional Equipment:   Intra-op Plan:   Post-operative Plan:   Informed Consent: I have reviewed the patients History and Physical, chart, labs and discussed the procedure including the risks, benefits and alternatives for the proposed anesthesia with the patient or authorized representative who has indicated his/her understanding and acceptance.     Plan Discussed with: Anesthesiologist, Surgeon and CRNA  Anesthesia Plan Comments:        Anesthesia Quick Evaluation

## 2013-03-09 NOTE — Progress Notes (Signed)
Pt in labor, orders obtained. Epidural prn.

## 2013-03-09 NOTE — Transfer of Care (Signed)
Immediate Anesthesia Transfer of Care Note  Patient: Jo Arnold  Procedure(s) Performed: Procedure(s): CESAREAN SECTION (N/A)  Patient Location: PACU  Anesthesia Type:Epidural  Level of Consciousness: awake  Airway & Oxygen Therapy: Patient Spontanous Breathing  Post-op Assessment: Report given to PACU RN and Post -op Vital signs reviewed and stable  Post vital signs: stable  Complications: No apparent anesthesia complications

## 2013-03-09 NOTE — Brief Op Note (Signed)
03/09/2013  9:05 PM  PATIENT:  Jo Arnold  27 y.o. female  PRE-OPERATIVE DIAGNOSIS:  non reassurring fetal heart rate  POST-OPERATIVE DIAGNOSIS:  same  PROCEDURE:  Procedure(s): CESAREAN SECTION (N/A)  SURGEON:  Surgeon(s) and Role:    * Jeani Hawking, MD - Primary  PHYSICIAN ASSISTANT:   ASSISTANTS: none   ANESTHESIA:   epidural  EBL:  Total I/O In: 2000 [I.V.:2000] Out: 600 [Urine:100; Blood:500]  BLOOD ADMINISTERED:none  DRAINS: Urinary Catheter (Foley)   LOCAL MEDICATIONS USED:  NONE  SPECIMEN:  No Specimen  DISPOSITION OF SPECIMEN:  N/A  COUNTS:  YES  TOURNIQUET:  * No tourniquets in log *  DICTATION: .Other Dictation: Dictation Number [id number]  PLAN OF CARE: Admit to inpatient   PATIENT DISPOSITION:  PACU - hemodynamically stable.   Delay start of Pharmacological VTE agent (>24hrs) due to surgical blood loss or risk of bleeding: not applicable

## 2013-03-10 LAB — CBC
HCT: 31.8 % — ABNORMAL LOW (ref 36.0–46.0)
Hemoglobin: 10.5 g/dL — ABNORMAL LOW (ref 12.0–15.0)
MCH: 28.4 pg (ref 26.0–34.0)
MCHC: 33 g/dL (ref 30.0–36.0)
MCV: 85.9 fL (ref 78.0–100.0)
Platelets: 158 10*3/uL (ref 150–400)
WBC: 25.9 10*3/uL — ABNORMAL HIGH (ref 4.0–10.5)

## 2013-03-10 NOTE — Lactation Note (Signed)
This note was copied from the chart of Jo Lan Entsminger. Lactation Consultation Note  Patient Name: Jo Arnold [id number] Date: 03/10/2013 Reason for consult: Initial assessment;Breast/nipple pain;Difficult latch and area of blistering on upper edge of both nipples sue to shallow latch.  Mom has baby latched in cross-cradle but c/o pinching and as she takes baby off breast, RN notices she does not break suction so RN showed her and later, LC reinforced how to break suction without causing nipple trauma.  Mom has small/symmetrical breasts and small areolas and button nipples.  Baby is able to latch well with LC assistance and chin tug technique for first few minutes and maintains latch and rhythmical sucking, intermittent swallows for 8 minutes and mom reports lessening of nipple discomfort.  She asks LC to demonstrate breaking suction and baby comes off with no additional nipple trauma, then re-latches quickly with only brief chin tug.  FOB shown how to assist and LC observes baby with widely flanged lips and he swallows immediately after re-latch.  LC encourages cue feedings and hand expressed colostrum/milk on mom's nipples before latch and after feeding, then comfort gelpads given and instructions for use between feedings reviewed with parents.  MGM (patient's mother is present but does not speak [place] (speaks [place], per mom).  LC discussed benefits of STS and LC encouraged review of Baby and Me pp 14 and 20-25 for STS and BF information. LC provided [employer] Resource brochure and reviewed [facility] services and list of community and web site resources.    Maternal Data Formula Feeding for Exclusion: No Infant to breast within first hour of birth: Yes (attempt) Has patient been taught Hand Expression?: Yes Does the patient have breastfeeding experience prior to this delivery?: No (mom attended prenatal breastfeeding class at [facility])  Feeding Feeding Type: Breast Fed Length of feed: 8 min  LATCH  Score/Interventions Latch: Grasps breast easily, tongue down, lips flanged, rhythmical sucking. (tight grasp relieved with chin tug) Intervention(s): Adjust position;Assist with latch;Breast compression  Audible Swallowing: Spontaneous and intermittent Intervention(s): Skin to skin;Hand expression Intervention(s): Skin to skin;Hand expression;Alternate breast massage  Type of Nipple: Everted at rest and after stimulation  Comfort (Breast/Nipple): Filling, red/small blisters or bruises, mild/mod discomfort  Problem noted: Mild/Moderate discomfort Interventions (Mild/moderate discomfort): Hand expression;Comfort gels  Hold (Positioning): Assistance needed to correctly position infant at breast and maintain latch. Intervention(s): Position options;Skin to skin;Support Pillows;Breastfeeding basics reviewed  LATCH Score: 8  Lactation Tools Discussed/Used Tools: Comfort gels STS, hand expression, cue feedings Chin tug during latch for deeper areolar grasp  Consult Status Consult Status: Follow-up Date: 03/11/13 Follow-up type: In-patient    Jo Arnold [facility] 03/10/2013, 8:52 PM

## 2013-03-10 NOTE — Anesthesia Postprocedure Evaluation (Signed)
  Anesthesia Post-op Note  Anesthesia Post Note  Patient: Jo Arnold  Procedure(s) Performed: Procedure(s) (LRB): CESAREAN SECTION (N/A)  Anesthesia type: Epidural  Patient location: Mother/Baby  Post pain: Pain level controlled  Post assessment: Post-op Vital signs reviewed  Last Vitals:  Filed Vitals:   03/10/13 0750  BP: 100/56  Pulse: 55  Temp: 37 C  Resp: 18    Post vital signs: Reviewed  Level of consciousness:alert  Complications: No apparent anesthesia complications

## 2013-03-10 NOTE — Anesthesia Postprocedure Evaluation (Signed)
  Anesthesia Post-op Note  Anesthesia Post Note  Patient: Jo Arnold  Procedure(s) Performed: Procedure(s) (LRB): CESAREAN SECTION (N/A)  Anesthesia type: Epidural  Patient location: PACU  Post pain: Pain level controlled  Post assessment: Post-op Vital signs reviewed  Post vital signs: stable  Level of consciousness: awake  Complications: No apparent anesthesia complications

## 2013-03-10 NOTE — Progress Notes (Signed)
MD Grewal called regarding patient's heart rate. New orders to lower the HR limit to 50. Will continue to monitor patient.

## 2013-03-10 NOTE — Op Note (Signed)
[name], Jo Arnold              ACCOUNT NO.:  [id number]  MEDICAL RECORD NO.:  [id number]  LOCATION:  9120                          FACILITY:  [facility]  PHYSICIAN:  Bradey Luzier L. Etienne Mowers, M.D.DATE OF BIRTH:  May 05, 1985  DATE OF PROCEDURE:  03/09/2013 DATE OF DISCHARGE:                              OPERATIVE REPORT   PREOPERATIVE DIAGNOSES:  Intrauterine pregnancy at 39 weeks and 2 days and failure to progress and chorioamnionitis and nonreassuring fetal heart rate.  POSTOPERATIVE DIAGNOSES:  Intrauterine pregnancy at 39 weeks and 2 days and failure to progress and chorioamnionitis and nonreassuring fetal heart rate.  PROCEDURE:  Primary low transverse cesarean section.  SURGEON:  Jennalyn Cawley L. Vincente Poli, M.D.  ANESTHESIA:  Epidural.  EBL:  Less than 500 mL.  COMPLICATIONS:  None.  DRAINS:  Foley catheter.  PROCEDURE:  This patient is a 27 year old, gravida 1, para 0, at 39 weeks and 2 days.  She was admitted in labor earlier today.  Shortly after admission, she received her epidural and an amniotomy was performed.  Approximately 4 p.m., she has been approximately 7-8 cm despite adequate [place] Units for over 4 hours with IUPC and Pitocin augmentation.  Approximately, 1 hour ago, she did have isolated deceleration and some reactivity after that but now is having what appears to be repetitive variable to late decelerations with decreasing variability.  Her temperature is 100.1.  Because of the failure to progress and the fetal heart rate tracing and her rise in temperature, I felt it is necessary to proceed for cesarean section.  The risks were reviewed with the patient.  The patient and her spouse agreed.  Consent was signed.  She was taken to the operating room.  She was prepped and draped and a sterile drape was applied.  A low transverse incision was made carried down to the fascia.  Fascia was scored in the midline and extended laterally.  The rectus muscles were separated  in the midline. The peritoneum was entered bluntly.  The peritoneal incision was then stretched.  A bladder blade was inserted.  The lower uterine segment was identified.  The bladder flap was created sharply.  A low transverse incision was made in the uterus.  The uterus was entered using a hemostat.  Amniotic fluid was clear.  The baby was in a transverse position and was delivered easily with 1 gentle pull of the vacuum.  The baby was a female infant with Apgars 8 at 1 minute, 9 at 5 minutes.  The cord was clamped and cut.  The baby was handed to the awaiting neonatal team.  The cord blood was subsequently obtained.  The placenta was manually removed, noted to be normal, intact with a 3-vessel cord.  The uterus was cleared of all clots and debris.  The uterine incision was closed in 2 layers using 0 chromic in a running locked stitch.  Irrigation was performed.  Hemostasis was adequate.  The peritoneum was closed using 0 Vicryl.  The fascia was closed using 0 Vicryl.  The skin was closed with 4-0 Vicryl on a Keith needle. Dermabond was applied.  All sponge, lap, and instrument counts were correct x2.  The patient went to  recovery room in stable condition.     Francess Mullen L. Vincente Poli, M.D.     [name]  D:  03/09/2013  T:  03/10/2013  Job:  [id number]

## 2013-03-10 NOTE — Progress Notes (Signed)
Subjective: Postpartum Day 1: Cesarean Delivery Patient reports tolerating PO.    Objective: Vital signs in last 24 hours: Temp:  [97.3 F (36.3 C)-100.1 F (37.8 C)] 98.4 F (36.9 C) (12/07 0205) Pulse Rate:  [55-108] 55 (12/07 0557) Resp:  [18-26] 18 (12/07 0557) BP: (95-137)/(52-112) 95/57 mmHg (12/07 0557) SpO2:  [95 %-100 %] 95 % (12/07 0557) Weight:  [61.689 kg (136 lb)] 61.689 kg (136 lb) (12/06 1106)  Physical Exam:  General: alert, cooperative and appears stated age Lochia: appropriate Uterine Fundus: firm Incision: healing well, no significant drainage, no dehiscence, no significant erythema DVT Evaluation: No evidence of DVT seen on physical exam.   Recent Labs  03/09/13 1015 03/10/13 0611  HGB 11.8* 10.5*  HCT 34.9* 31.8*    Assessment/Plan: Status post Cesarean section. Doing well postoperatively.  Continue current care.  Jenniffer Vessels L 03/10/2013, 7:43 AM

## 2013-03-11 ENCOUNTER — Encounter (HOSPITAL_COMMUNITY): Payer: Self-pay | Admitting: Obstetrics and Gynecology

## 2013-03-11 NOTE — Progress Notes (Signed)
Subjective: Postpartum Day 2: Cesarean Delivery Patient reports tolerating PO and no problems voiding.    Objective: Vital signs in last 24 hours: Temp:  [97 F (36.1 C)-99.1 F (37.3 C)] 97 F (36.1 C) (12/08 0530) Pulse Rate:  [58-62] 62 (12/08 0530) Resp:  [16-18] 18 (12/08 0530) BP: (95-99)/(55-64) 98/64 mmHg (12/08 0530) SpO2:  [95 %-96 %] 95 % (12/07 1601)  Physical Exam:  General: alert and cooperative Lochia: appropriate Uterine Fundus: firm Incision: scant drainage noted on bandage DVT Evaluation: No evidence of DVT seen on physical exam. Negative Homan's sign. No cords or calf tenderness. No significant calf/ankle edema.   Recent Labs  03/09/13 1015 03/10/13 0611  HGB 11.8* 10.5*  HCT 34.9* 31.8*    Assessment/Plan: Status post Cesarean section. Doing well postoperatively.  Does not desire circ.  CURTIS,CAROL G 03/11/2013, 7:54 AM

## 2013-03-11 NOTE — Lactation Note (Signed)
This note was copied from the chart of Jo Anijah Spohr. Lactation Consultation Note: Follow up visit with mom who complains of very sore nipples. Both nipples scabbed and pink. Reports that baby last fed about 2 1/2 hours ago. Unwrapped and positioned with mom but too sleepy to nurse. Encouraged mom to page when baby wakes for next feeding. Mom reports that she has pumped but that hurts too and she is not getting any milk out. Reassurance given. Reviewed how to turn suction down on pump. No further questions at present.  Patient Name: Jo Arnold ZOXWR'U Date: 03/11/2013 Reason for consult: Follow-up assessment   Maternal Data    Feeding Feeding Type: Breast Fed  LATCH Score/Interventions Latch: Too sleepy or reluctant, no latch achieved, no sucking elicited.  Audible Swallowing: None  Type of Nipple: Everted at rest and after stimulation  Comfort (Breast/Nipple): Filling, red/small blisters or bruises, mild/mod discomfort  Problem noted: Mild/Moderate discomfort Interventions (Mild/moderate discomfort): Comfort gels  Hold (Positioning): Assistance needed to correctly position infant at breast and maintain latch. Intervention(s): Breastfeeding basics reviewed;Support Pillows  LATCH Score: 4  Lactation Tools Discussed/Used Tools: Comfort gels   Consult Status Consult Status: Follow-up Date: 03/11/13 Follow-up type: In-patient    Pamelia Hoit 03/11/2013, 10:50 AM

## 2013-03-11 NOTE — Lactation Note (Signed)
This note was copied from the chart of Jo Arnold. Lactation Consultation Note  Follow up visit at 46 hours of age.  Mom reports pain with latch.  Mom has baby in modified football hold on right breast she reports pain of "8" on scale of 1-10.  Repositioned and improved to "5-6"  Encouraged mom to re-latch if her pain is greater than that at future feedings.  Moms nipple are pink and blistered.  Compressed with positional stripe when unlatched.  Moved to cross cradle hold and nipple remains in good shape after feeding.  Baby has good sucking pattern with wide open flanged lips.  Few swallows visible and audible.  Baby moved to left breast. In cross cradle hold with pain "8-10", baby appears to have great latch, but mom is in too much pain.  Left nipple is blistered causing a change in shape and appears different than right nipple.  Encouraged a break from the left breast and did hand expression to spoon feed baby.  After colostrum collected baby asleep and not waking up.  Colostrum of collected in medicine cup for next feeding.  Encouraged to right breast and hand express right.  Mom complains of pump hurting a lot also.  Comfort gels encouraged.  Report given to Antoine Poche RN who will assist with spoon feeding as needed.  Mom to call for assist as needed.   Patient Name: Jo Francee Setzer OZHYQ'M Date: 03/11/2013 Reason for consult: Follow-up assessment   Maternal Data    Feeding Feeding Type: Breast Fed Length of feed: 15 min  LATCH Score/Interventions Latch: Grasps breast easily, tongue down, lips flanged, rhythmical sucking. Intervention(s): Waking techniques;Teach feeding cues;Skin to skin Intervention(s): Adjust position;Assist with latch;Breast massage;Breast compression  Audible Swallowing: A few with stimulation Intervention(s): Hand expression;Skin to skin Intervention(s): Alternate breast massage  Type of Nipple: Everted at rest and after stimulation  Comfort  (Breast/Nipple): Filling, red/small blisters or bruises, mild/mod discomfort  Problem noted: Cracked, bleeding, blisters, bruises Interventions (Mild/moderate discomfort): Hand expression;Comfort gels  Hold (Positioning): Assistance needed to correctly position infant at breast and maintain latch. Intervention(s): Position options;Skin to skin;Support Pillows;Breastfeeding basics reviewed  LATCH Score: 7  Lactation Tools Discussed/Used Tools: Comfort gels;Other (comment)   Consult Status Consult Status: Follow-up Date: 03/12/13 Follow-up type: In-patient    Beverely Risen Arvella Merles 03/11/2013, 9:33 PM

## 2013-03-12 MED ORDER — IBUPROFEN 600 MG PO TABS: 600.0000 mg | ORAL_TABLET | Freq: Four times a day (QID) | ORAL | Status: DC

## 2013-03-12 MED ORDER — OXYCODONE-ACETAMINOPHEN 5-325 MG PO TABS
1.0000 | ORAL_TABLET | ORAL | Status: DC | PRN
Start: 2013-03-12 — End: 2013-09-12

## 2013-03-12 NOTE — Discharge Summary (Signed)
Obstetric Discharge Summary Reason for Admission: onset of labor Prenatal Procedures: ultrasound Intrapartum Procedures: cesarean: low cervical, transverse Postpartum Procedures: none Complications-Operative and Postpartum: none Hemoglobin  Date Value Range Status  03/10/2013 10.5* 12.0 - 15.0 Arnold/dL Final     HCT  Date Value Range Status  03/10/2013 31.8* 36.0 - 46.0 % Final    Physical Exam:  General: alert and cooperative Lochia: appropriate Uterine Fundus: firm Incision: honeycomb dressing CDI DVT Evaluation: No evidence of DVT seen on physical exam. Negative Homan's sign. No cords or calf tenderness. No significant calf/ankle edema.  Discharge Diagnoses: Term Pregnancy-delivered  Discharge Information: Date: 03/12/2013 Activity: pelvic rest Diet: routine Medications: PNV, Ibuprofen and Percocet Condition: stable Instructions: refer to practice specific booklet Discharge to: home   Newborn Data: Live born female  Birth Weight: 6 lb 10.2 oz (3011 Arnold) APGAR: 8, 9  Home with mother.  Jo Arnold 03/12/2013, 7:58 AM

## 2013-05-02 LAB — HM PAP SMEAR: HM Pap smear: NORMAL

## 2013-08-12 ENCOUNTER — Encounter: Payer: Self-pay | Admitting: Family Medicine

## 2013-09-12 ENCOUNTER — Ambulatory Visit (INDEPENDENT_AMBULATORY_CARE_PROVIDER_SITE_OTHER): Payer: 59 | Admitting: Physician Assistant

## 2013-09-12 ENCOUNTER — Encounter: Payer: Self-pay | Admitting: Physician Assistant

## 2013-09-12 VITALS — BP 102/68 | HR 76 | Temp 98.3°F | Resp 18 | Ht 59.75 in | Wt 110.0 lb

## 2013-09-12 DIAGNOSIS — Z0000 Encounter for general adult medical examination without abnormal findings: Secondary | ICD-10-CM

## 2013-09-12 NOTE — Progress Notes (Signed)
Patient ID: Jo Arnold MRN: [id number], DOB: Dec 04, 1985, 28 y.o. Date of Encounter: 09/12/2013,   Chief Complaint: Physical (CPE)  HPI: 28 y.o. y/o female  here for CPE.   She is originally from [place]. Says that she lived there until age 64.  She is married. Has one child. Her son is 81 months old. He says that she has not been getting sleep that since he has been born.  Says that the only concern she has had recently that she wanted to have evaluated it was that she sometimes feels lightheaded when she first stands up.  No other complaints or concerns.    Review of Systems: Consitutional: No fever, chills, fatigue, night sweats, lymphadenopathy. No significant/unexplained weight changes. Eyes: No visual changes, eye redness, or discharge. ENT/Mouth: No ear pain, sore throat, nasal drainage, or sinus pain. Cardiovascular: No chest pressure,heaviness, tightness or squeezing, even with exertion. No increased shortness of breath or dyspnea on exertion.No palpitations, edema, orthopnea, PND. Respiratory: No cough, hemoptysis, SOB, or wheezing. Gastrointestinal: No anorexia, dysphagia, reflux, pain, nausea, vomiting, hematemesis, diarrhea, constipation, BRBPR, or melena. Breast: No mass, nodules, bulging, or retraction. No skin changes or inflammation. No nipple discharge. No lymphadenopathy. Genitourinary: No dysuria, hematuria, incontinence, vaginal discharge, pruritis, burning, abnormal bleeding, or pain. Musculoskeletal: No decreased ROM, No joint pain or swelling. No significant pain in neck, back, or extremities. Skin: No rash, pruritis, or concerning lesions. Neurological: No headache, dizziness, syncope, seizures, tremors, memory loss, coordination problems, or paresthesias. Psychological: No anxiety, depression, hallucinations, SI/HI. Endocrine: No polydipsia, polyphagia, polyuria, or known diabetes.No increased fatigue. No palpitations/rapid heart rate. No  significant/unexplained weight change. All other systems were reviewed and are otherwise negative.  Past Medical History  Diagnosis Date  . No pertinent past medical history   . Hx of varicella   . Hx of pyelonephritis   . Abnormal Pap smear   . Medical history non-contributory   . Anemia 12/2010     Past Surgical History  Procedure Laterality Date  . No past surgeries    . Cesarean section N/A 03/09/2013    Procedure: CESAREAN SECTION;  Surgeon: Jeani Hawking, MD;  Location: [facility];  Service: Obstetrics;  Laterality: N/A;    Home Meds:  Outpatient Prescriptions Prior to Visit  Medication Sig Dispense Refill  . Prenatal Vit-Fe Fumarate-FA (PRENATAL MULTIVITAMIN) TABS Take 1 tablet by mouth daily.      Marland Kitchen ibuprofen (ADVIL,MOTRIN) 600 MG tablet Take 1 tablet (600 mg total) by mouth every 6 (six) hours.  30 tablet  1  . oxyCODONE-acetaminophen (PERCOCET/ROXICET) 5-325 MG per tablet Take 1-2 tablets by mouth every 4 (four) hours as needed for severe pain (moderate - severe pain).  30 tablet  0   No facility-administered medications prior to visit.    Allergies: No Known Allergies  History   Social History  . Marital Status: Married    Spouse Name: N/A    Number of Children: N/A  . Years of Education: N/A   Occupational History  . Not on file.   Social History Main Topics  . Smoking status: Never Smoker   . Smokeless tobacco: Never Used  . Alcohol Use: No     Comment: before pregnancy  . Drug Use: No  . Sexual Activity: Yes    Birth Control/ Protection: None, Condom   Other Topics Concern  . Not on file   Social History Narrative   Married.    1 child--son-- as of 09/2013- he  is 556 months old   She stays home with baby   She lived in [place] until age 28 y/o   Then lived in [place]   [place]    Family History  Problem Relation Age of Onset  . Hypertension Mother   . Hyperlipidemia Mother   . Heart disease Father     bypass age 28  .  Stroke Father   . Heart disease Brother     bypass age 28  . Alcohol abuse Brother   . Stroke Brother     Physical Exam: Blood pressure 102/68, pulse 76, temperature 98.3 F (36.8 C), temperature source Oral, resp. rate 18, height 4' 11.75" (1.518 m), weight 110 lb (49.896 kg), currently breastfeeding., Body mass index is 21.65 kg/(m^2). General: Well developed, well nourished, WF. Appears in no acute distress. HEENT: Normocephalic, atraumatic. Conjunctiva pink, sclera non-icteric. Pupils 2 mm constricting to 1 mm, round, regular, and equally reactive to light and accomodation. EOMI. Internal auditory canal clear. TMs with good cone of light and without pathology. Nasal mucosa pink. Nares are without discharge. No sinus tenderness. Oral mucosa pink.  Pharynx without exudate.   Neck: Supple. Trachea midline. No thyromegaly. Full ROM. No lymphadenopathy.No Carotid Bruits. Lungs: Clear to auscultation bilaterally without wheezes, rales, or rhonchi. Breathing is of normal effort and unlabored. Cardiovascular: RRR with S1 S2. No murmurs, rubs, or gallops. Distal pulses 2+ symmetrically. No carotid or abdominal bruits. Breast: Deferred. She sees Gyn Abdomen: Soft, non-tender, non-distended with normoactive bowel sounds. No hepatosplenomegaly or masses. No rebound/guarding. No CVA tenderness. No hernias.  Genitourinary: Deferred. Sees Gyn. Musculoskeletal: Full range of motion and 5/5 strength throughout. Without swelling, atrophy, tenderness, crepitus, or warmth. Extremities without clubbing, cyanosis, or edema. Calves supple. Skin: Warm and moist without erythema, ecchymosis, wounds, or rash. Neuro: A+Ox3. CN II-XII grossly intact. Moves all extremities spontaneously. Full sensation throughout. Normal gait. DTR 2+ throughout upper and lower extremities. Finger to nose intact. Psych:  Responds to questions appropriately with a normal affect.   Assessment/Plan:  28 y.o. y/o female here for CPE 1.  Visit for preventive health examination  A. Screening Labs:  She is not fasting today and is not sure if she can easily come back fasting. Therefore wants to go ahead and check other labs while she is here today. - CBC with Differential - COMPLETE METABOLIC PANEL WITH GFR - TSH - Vit D  25 hydroxy (rtn osteoporosis monitoring)  B. Pap: Per Gyn  C. Immunizations:  Influenza: N/A Tetanus: She thinks she has had this in past 10 years.  2. Lightheadedness-- I think this is occurring because her blood pressure is low. Especially given that it occurs when she goes to a standing position.  Most likely this is occurring as a combination of low blood pressure, sleep deprivation, hormone fluctuations ( she is breast-feeding and her baby is 296 months old). Check labs for anemia and thyroid abnormalities etc. If labs are normal then I think that the above is the cause of her symptoms.  Murray HodgkinsSigned, Mary Beth Linn Grove[place], [place], [facility] 09/12/2013 2:32 PM

## 2013-09-13 LAB — CBC WITH DIFFERENTIAL/PLATELET
Basophils Absolute: 0.1 10*3/uL (ref 0.0–0.1)
Basophils Relative: 1 % (ref 0–1)
EOS ABS: 0.1 10*3/uL (ref 0.0–0.7)
EOS PCT: 2 % (ref 0–5)
HCT: 41.7 % (ref 36.0–46.0)
Hemoglobin: 13.7 g/dL (ref 12.0–15.0)
LYMPHS ABS: 2 10*3/uL (ref 0.7–4.0)
Lymphocytes Relative: 34 % (ref 12–46)
MCH: 29.4 pg (ref 26.0–34.0)
MCHC: 32.9 g/dL (ref 30.0–36.0)
MCV: 89.5 fL (ref 78.0–100.0)
MONOS PCT: 8 % (ref 3–12)
Monocytes Absolute: 0.5 10*3/uL (ref 0.1–1.0)
Neutro Abs: 3.2 10*3/uL (ref 1.7–7.7)
Neutrophils Relative %: 55 % (ref 43–77)
Platelets: 248 10*3/uL (ref 150–400)
RBC: 4.66 MIL/uL (ref 3.87–5.11)
RDW: 13.6 % (ref 11.5–15.5)
WBC: 5.8 10*3/uL (ref 4.0–10.5)

## 2013-09-13 LAB — COMPLETE METABOLIC PANEL WITH GFR
ALT: 16 U/L (ref 0–35)
AST: 21 U/L (ref 0–37)
Albumin: 4.7 g/dL (ref 3.5–5.2)
Alkaline Phosphatase: 122 U/L — ABNORMAL HIGH (ref 39–117)
BILIRUBIN TOTAL: 0.5 mg/dL (ref 0.2–1.2)
BUN: 13 mg/dL (ref 6–23)
CO2: 28 mEq/L (ref 19–32)
CREATININE: 0.56 mg/dL (ref 0.50–1.10)
Calcium: 10.5 mg/dL (ref 8.4–10.5)
Chloride: 101 mEq/L (ref 96–112)
GFR, Est African American: >89 mL/min
Glucose, Bld: 79 mg/dL (ref 70–99)
Potassium: 5.3 mEq/L (ref 3.5–5.3)
Sodium: 140 mEq/L (ref 135–145)
Total Protein: 7.6 g/dL (ref 6.0–8.3)

## 2013-09-13 LAB — TSH: TSH: 0.821 u[IU]/mL (ref 0.350–4.500)

## 2013-09-13 LAB — VITAMIN D 25 HYDROXY (VIT D DEFICIENCY, FRACTURES): Vit D, 25-Hydroxy: 35 ng/mL (ref 30–89)

## 2013-09-16 ENCOUNTER — Ambulatory Visit: Payer: 59 | Admitting: Physician Assistant

## 2014-02-03 ENCOUNTER — Encounter: Payer: Self-pay | Admitting: Physician Assistant

## 2014-04-10 ENCOUNTER — Other Ambulatory Visit: Payer: Self-pay | Admitting: Obstetrics and Gynecology

## 2014-04-10 DIAGNOSIS — N6001 Solitary cyst of right breast: Secondary | ICD-10-CM

## 2014-04-11 ENCOUNTER — Ambulatory Visit
Admission: RE | Admit: 2014-04-11 | Discharge: 2014-04-11 | Disposition: A | Discharge status: Home / Self Care | Payer: 59 | Source: Ambulatory Visit | Attending: Obstetrics and Gynecology | Admitting: Obstetrics and Gynecology

## 2014-04-11 DIAGNOSIS — N6001 Solitary cyst of right breast: Secondary | ICD-10-CM

## 2014-10-21 LAB — OB RESULTS CONSOLE RPR: RPR: NONREACTIVE

## 2014-10-21 LAB — OB RESULTS CONSOLE HEPATITIS B SURFACE ANTIGEN: Hepatitis B Surface Ag: NEGATIVE

## 2014-10-21 LAB — OB RESULTS CONSOLE GC/CHLAMYDIA
CHLAMYDIA, DNA PROBE: NEGATIVE
GC PROBE AMP, GENITAL: NEGATIVE

## 2014-10-21 LAB — OB RESULTS CONSOLE HIV ANTIBODY (ROUTINE TESTING): HIV: NONREACTIVE

## 2014-10-21 LAB — OB RESULTS CONSOLE RUBELLA ANTIBODY, IGM: Rubella: IMMUNE

## 2015-05-24 ENCOUNTER — Inpatient Hospital Stay (HOSPITAL_COMMUNITY)
Admission: AD | Admit: 2015-05-24 | Discharge: 2015-05-24 | Disposition: A | Discharge status: Home / Self Care | Payer: 59 | Source: Ambulatory Visit | Attending: Obstetrics and Gynecology | Admitting: Obstetrics and Gynecology

## 2015-05-24 ENCOUNTER — Encounter (HOSPITAL_COMMUNITY): Payer: Self-pay | Admitting: *Deleted

## 2015-05-24 DIAGNOSIS — Z3493 Encounter for supervision of normal pregnancy, unspecified, third trimester: Secondary | ICD-10-CM | POA: Diagnosis not present

## 2015-05-24 LAB — OB RESULTS CONSOLE GBS: STREP GROUP B AG: POSITIVE

## 2015-05-24 NOTE — MAU Note (Signed)
contractions since last night

## 2015-05-24 NOTE — Progress Notes (Signed)
Phone call to dr Rana Snare. Pt may d/c home

## 2015-05-29 ENCOUNTER — Encounter (HOSPITAL_COMMUNITY): Payer: Self-pay | Admitting: *Deleted

## 2015-05-29 ENCOUNTER — Inpatient Hospital Stay (HOSPITAL_COMMUNITY)
Admission: AD | Admit: 2015-05-29 | Discharge: 2015-05-31 | DRG: 775 | Disposition: A | Discharge status: Home / Self Care | Payer: 59 | Source: Ambulatory Visit | Attending: Obstetrics and Gynecology | Admitting: Obstetrics and Gynecology

## 2015-05-29 ENCOUNTER — Inpatient Hospital Stay (HOSPITAL_COMMUNITY): Payer: 59 | Admitting: Anesthesiology

## 2015-05-29 DIAGNOSIS — Z8249 Family history of ischemic heart disease and other diseases of the circulatory system: Secondary | ICD-10-CM

## 2015-05-29 DIAGNOSIS — Z823 Family history of stroke: Secondary | ICD-10-CM | POA: Diagnosis not present

## 2015-05-29 DIAGNOSIS — IMO0001 Reserved for inherently not codable concepts without codable children: Secondary | ICD-10-CM

## 2015-05-29 DIAGNOSIS — Z3A39 39 weeks gestation of pregnancy: Secondary | ICD-10-CM | POA: Diagnosis not present

## 2015-05-29 DIAGNOSIS — O99824 Streptococcus B carrier state complicating childbirth: Principal | ICD-10-CM | POA: Diagnosis present

## 2015-05-29 HISTORY — DX: Unspecified abnormal cytological findings in specimens from vagina: R87.629

## 2015-05-29 LAB — CBC
HEMATOCRIT: 37.3 % (ref 36.0–46.0)
HEMOGLOBIN: 12.4 g/dL (ref 12.0–15.0)
MCH: 28.1 pg (ref 26.0–34.0)
MCHC: 33.2 g/dL (ref 30.0–36.0)
MCV: 84.4 fL (ref 78.0–100.0)
Platelets: 235 10*3/uL (ref 150–400)
RBC: 4.42 MIL/uL (ref 3.87–5.11)
RDW: 13.7 % (ref 11.5–15.5)
WBC: 16.7 10*3/uL — ABNORMAL HIGH (ref 4.0–10.5)

## 2015-05-29 LAB — TYPE AND SCREEN
ABO/RH(D): O POS
Antibody Screen: NEGATIVE

## 2015-05-29 MED ORDER — WITCH HAZEL-GLYCERIN EX PADS: 1.0000 "application " | MEDICATED_PAD | CUTANEOUS | Status: DC | PRN

## 2015-05-29 MED ORDER — ZOLPIDEM TARTRATE 5 MG PO TABS: 5.0000 mg | ORAL_TABLET | Freq: Every evening | ORAL | Status: DC | PRN

## 2015-05-29 MED ORDER — OXYCODONE-ACETAMINOPHEN 5-325 MG PO TABS: 2.0000 | ORAL_TABLET | ORAL | Status: DC | PRN

## 2015-05-29 MED ORDER — ACETAMINOPHEN 325 MG PO TABS: 650.0000 mg | ORAL_TABLET | ORAL | Status: DC | PRN

## 2015-05-29 MED ORDER — LACTATED RINGERS IV SOLN
2.5000 [IU]/h | INTRAVENOUS | Status: DC
  Administered 2015-05-29: 39.96 [IU]/h via INTRAVENOUS
  Filled 2015-05-29: qty 4

## 2015-05-29 MED ORDER — CITRIC ACID-SODIUM CITRATE 334-500 MG/5ML PO SOLN: 30.0000 mL | ORAL | Status: DC | PRN

## 2015-05-29 MED ORDER — SODIUM BICARBONATE 8.4 % IV SOLN
INTRAVENOUS | Status: DC | PRN
  Administered 2015-05-29: 5 mL via EPIDURAL

## 2015-05-29 MED ORDER — LANOLIN HYDROUS EX OINT: TOPICAL_OINTMENT | CUTANEOUS | Status: DC | PRN

## 2015-05-29 MED ORDER — TETANUS-DIPHTH-ACELL PERTUSSIS 5-2.5-18.5 LF-MCG/0.5 IM SUSP
0.5000 mL | Freq: Once | INTRAMUSCULAR | Status: AC
  Administered 2015-05-31: 0.5 mL via INTRAMUSCULAR
  Filled 2015-05-29: qty 0.5

## 2015-05-29 MED ORDER — IBUPROFEN 600 MG PO TABS
600.0000 mg | ORAL_TABLET | Freq: Four times a day (QID) | ORAL | Status: DC
  Administered 2015-05-30 – 2015-05-31 (×8): 600 mg via ORAL
  Filled 2015-05-29 (×8): qty 1

## 2015-05-29 MED ORDER — LIDOCAINE HCL (PF) 1 % IJ SOLN
30.0000 mL | INTRAMUSCULAR | Status: DC | PRN
  Filled 2015-05-29: qty 30

## 2015-05-29 MED ORDER — SODIUM CHLORIDE 0.9 % IV SOLN
2.0000 g | INTRAVENOUS | Status: AC
  Administered 2015-05-29: 2 g via INTRAVENOUS
  Filled 2015-05-29: qty 2000

## 2015-05-29 MED ORDER — PHENYLEPHRINE 40 MCG/ML (10ML) SYRINGE FOR IV PUSH (FOR BLOOD PRESSURE SUPPORT)
80.0000 ug | PREFILLED_SYRINGE | INTRAVENOUS | Status: DC | PRN
Start: 2015-05-29 — End: 2015-05-29
  Administered 2015-05-29 (×2): 80 ug via INTRAVENOUS
  Filled 2015-05-29: qty 2

## 2015-05-29 MED ORDER — SIMETHICONE 80 MG PO CHEW: 80.0000 mg | CHEWABLE_TABLET | ORAL | Status: DC | PRN

## 2015-05-29 MED ORDER — EPHEDRINE 5 MG/ML INJ
10.0000 mg | INTRAVENOUS | Status: DC | PRN
Start: 2015-05-29 — End: 2015-05-29
  Filled 2015-05-29: qty 2

## 2015-05-29 MED ORDER — FLEET ENEMA 7-19 GM/118ML RE ENEM: 1.0000 | ENEMA | RECTAL | Status: DC | PRN

## 2015-05-29 MED ORDER — ONDANSETRON HCL 4 MG PO TABS: 4.0000 mg | ORAL_TABLET | ORAL | Status: DC | PRN

## 2015-05-29 MED ORDER — BENZOCAINE-MENTHOL 20-0.5 % EX AERO
1.0000 "application " | INHALATION_SPRAY | CUTANEOUS | Status: DC | PRN
  Administered 2015-05-30: 1 via TOPICAL
  Filled 2015-05-29: qty 56

## 2015-05-29 MED ORDER — PENICILLIN G POTASSIUM 5000000 UNITS IJ SOLR
2.5000 10*6.[IU] | INTRAVENOUS | Status: DC
  Filled 2015-05-29 (×2): qty 2.5

## 2015-05-29 MED ORDER — PENICILLIN G POTASSIUM 5000000 UNITS IJ SOLR
5.0000 10*6.[IU] | Freq: Once | INTRAVENOUS | Status: AC
  Administered 2015-05-29: 5 10*6.[IU] via INTRAVENOUS
  Filled 2015-05-29: qty 5

## 2015-05-29 MED ORDER — PHENYLEPHRINE 40 MCG/ML (10ML) SYRINGE FOR IV PUSH (FOR BLOOD PRESSURE SUPPORT)
80.0000 ug | PREFILLED_SYRINGE | INTRAVENOUS | Status: DC | PRN
  Filled 2015-05-29: qty 2
  Filled 2015-05-29: qty 20

## 2015-05-29 MED ORDER — EPHEDRINE 5 MG/ML INJ
10.0000 mg | INTRAVENOUS | Status: DC | PRN
  Filled 2015-05-29: qty 2

## 2015-05-29 MED ORDER — OXYCODONE-ACETAMINOPHEN 5-325 MG PO TABS: 1.0000 | ORAL_TABLET | ORAL | Status: DC | PRN

## 2015-05-29 MED ORDER — LACTATED RINGERS IV SOLN
INTRAVENOUS | Status: DC
  Administered 2015-05-29: 20:00:00 via INTRAVENOUS

## 2015-05-29 MED ORDER — SENNOSIDES-DOCUSATE SODIUM 8.6-50 MG PO TABS
2.0000 | ORAL_TABLET | ORAL | Status: DC
  Administered 2015-05-30 (×2): 2 via ORAL
  Filled 2015-05-29 (×2): qty 2

## 2015-05-29 MED ORDER — DIBUCAINE 1 % RE OINT: 1.0000 "application " | TOPICAL_OINTMENT | RECTAL | Status: DC | PRN

## 2015-05-29 MED ORDER — ONDANSETRON HCL 4 MG/2ML IJ SOLN: 4.0000 mg | INTRAMUSCULAR | Status: DC | PRN

## 2015-05-29 MED ORDER — LACTATED RINGERS IV SOLN: 500.0000 mL | Freq: Once | INTRAVENOUS | Status: DC

## 2015-05-29 MED ORDER — DIPHENHYDRAMINE HCL 50 MG/ML IJ SOLN: 12.5000 mg | INTRAMUSCULAR | Status: DC | PRN

## 2015-05-29 MED ORDER — DIPHENHYDRAMINE HCL 25 MG PO CAPS: 25.0000 mg | ORAL_CAPSULE | Freq: Four times a day (QID) | ORAL | Status: DC | PRN

## 2015-05-29 MED ORDER — BUTORPHANOL TARTRATE 1 MG/ML IJ SOLN
1.0000 mg | INTRAMUSCULAR | Status: DC | PRN
Start: 2015-05-29 — End: 2015-05-29

## 2015-05-29 MED ORDER — OXYTOCIN BOLUS FROM INFUSION: 500.0000 mL | INTRAVENOUS | Status: DC

## 2015-05-29 MED ORDER — LACTATED RINGERS IV SOLN: 500.0000 mL | INTRAVENOUS | Status: DC | PRN

## 2015-05-29 MED ORDER — ONDANSETRON HCL 4 MG/2ML IJ SOLN: 4.0000 mg | Freq: Four times a day (QID) | INTRAMUSCULAR | Status: DC | PRN

## 2015-05-29 MED ORDER — FENTANYL 2.5 MCG/ML BUPIVACAINE 1/10 % EPIDURAL INFUSION (WH - ANES)
14.0000 mL/h | INTRAMUSCULAR | Status: DC | PRN
  Administered 2015-05-29: 14 mL/h via EPIDURAL
  Administered 2015-05-29: 12 mL/h via EPIDURAL
  Filled 2015-05-29: qty 125

## 2015-05-29 MED ORDER — LIDOCAINE HCL (PF) 1 % IJ SOLN
INTRAMUSCULAR | Status: DC | PRN
  Administered 2015-05-29 (×2): 4 mL

## 2015-05-29 MED ORDER — PRENATAL MULTIVITAMIN CH
1.0000 | ORAL_TABLET | Freq: Every day | ORAL | Status: DC
  Administered 2015-05-30 – 2015-05-31 (×2): 1 via ORAL
  Filled 2015-05-29 (×3): qty 1

## 2015-05-29 NOTE — Anesthesia Procedure Notes (Signed)
Epidural Patient location during procedure: OB  Staffing Anesthesiologist: Mattilynn Forrer Performed by: anesthesiologist   Preanesthetic Checklist Completed: patient identified, site marked, surgical consent, pre-op evaluation, timeout performed, IV checked, risks and benefits discussed and monitors and equipment checked  Epidural Patient position: sitting Prep: site prepped and draped and DuraPrep Patient monitoring: continuous pulse ox and blood pressure Approach: midline Location: L3-L4 Injection technique: LOR saline  Needle:  Needle type: Tuohy  Needle gauge: 17 G Needle length: 9 cm and 9 Needle insertion depth: 5 cm cm Catheter type: closed end flexible Catheter size: 19 Gauge Catheter at skin depth: 10 cm Test dose: negative  Assessment Events: blood not aspirated, injection not painful, no injection resistance, negative IV test and no paresthesia  Additional Notes Patient identified. Risks/Benefits/Options discussed with patient including but not limited to bleeding, infection, nerve damage, paralysis, failed block, incomplete pain control, headache, blood pressure changes, nausea, vomiting, reactions to medication both or allergic, itching and postpartum back pain. Confirmed with bedside nurse the patient's most recent platelet count. Confirmed with patient that they are not currently taking any anticoagulation, have any bleeding history or any family history of bleeding disorders. Patient expressed understanding and wished to proceed. All questions were answered. Sterile technique was used throughout the entire procedure. Please see nursing notes for vital signs. Test dose was given through epidural catheter and negative prior to continuing to dose epidural or start infusion. Warning signs of high block given to the patient including shortness of breath, tingling/numbness in hands, complete motor block, or any concerning symptoms with instructions to call for help. Patient was  given instructions on fall risk and not to get out of bed. All questions and concerns addressed with instructions to call with any issues or inadequate analgesia.      

## 2015-05-29 NOTE — Progress Notes (Signed)
Delivery Note At 8:32 PM a viable female was delivered via Vaginal, Spontaneous Delivery (Presentation: ; Occiput Anterior).  APGAR: 8, 9; weight  .   Placenta status:intack ,to pathology .  Cord:  with the following complications: .  Cord pH: pending  Patient had N/V if supine to push. Labored down to +3. Set up for delivery and delivered in about 4 UCs. Some terminal bradycardia-therefore MLE done.  Anesthesia: Epidural  Episiotomy:  Small second degree MLE Lacerations:   Suture Repair: 2.0 vicryl rapide Est. Blood Loss (mL):     Mom to postpartum.  Baby to Couplet care / Skin to Skin.  Jo Arnold,Jo Arnold E 05/29/2015, 8:49 PM

## 2015-05-29 NOTE — Progress Notes (Signed)
Epidural redosed 

## 2015-05-29 NOTE — Consult Note (Signed)
Neonatology Note:   Attendance at C-section:    I was asked by Dr. Henderson Cloud to attend this NSVD due to meconium of a term female. The mother is 29yo and GBS + with one dose PCN and Amp <4hrs prior to delivery.  ROM 3hrs before delivery, fluid meconium. Infant vigorous with good spontaneous cry and tone. Placed on mother.  Needed only minimal bulb suctioning. Ap 8, 9. Lungs clear to ausc in DR. To CN to care of Pediatrician.  Jamie Brookes, MD

## 2015-05-29 NOTE — Anesthesia Preprocedure Evaluation (Signed)
Anesthesia Evaluation  Patient identified by MRN, date of birth, ID band Patient awake    Reviewed: Allergy & Precautions, NPO status , Patient's Chart, lab work & pertinent test results  History of Anesthesia Complications Negative for: history of anesthetic complications  Airway Mallampati: II  TM Distance: >3 FB Neck ROM: Full    Dental no notable dental hx. (+) Dental Advisory Given   Pulmonary neg pulmonary ROS,    Pulmonary exam normal breath sounds clear to auscultation       Cardiovascular negative cardio ROS Normal cardiovascular exam Rhythm:Regular Rate:Normal     Neuro/Psych negative neurological ROS  negative psych ROS   GI/Hepatic negative GI ROS, Neg liver ROS,   Endo/Other  negative endocrine ROS  Renal/GU negative Renal ROS  negative genitourinary   Musculoskeletal negative musculoskeletal ROS (+)   Abdominal   Peds negative pediatric ROS (+)  Hematology negative hematology ROS (+)   Anesthesia Other Findings   Reproductive/Obstetrics (+) Pregnancy                             Anesthesia Physical Anesthesia Plan  ASA: II  Anesthesia Plan: Epidural   Post-op Pain Management:    Induction:   Airway Management Planned:   Additional Equipment:   Intra-op Plan:   Post-operative Plan:   Informed Consent: I have reviewed the patients History and Physical, chart, labs and discussed the procedure including the risks, benefits and alternatives for the proposed anesthesia with the patient or authorized representative who has indicated his/her understanding and acceptance.   Dental advisory given  Plan Discussed with: CRNA  Anesthesia Plan Comments:         Anesthesia Quick Evaluation  

## 2015-05-29 NOTE — MAU Note (Addendum)
Onset of contractions about 2 hours, between 2 to 4 minutes apart, no vaginal bleeding, no LOF. Patient was seen by Dr. Huntley Dec today 2 cm

## 2015-05-30 LAB — CBC
HEMATOCRIT: 32.9 % — AB (ref 36.0–46.0)
HEMOGLOBIN: 10.8 g/dL — AB (ref 12.0–15.0)
MCH: 27.6 pg (ref 26.0–34.0)
MCHC: 32.8 g/dL (ref 30.0–36.0)
MCV: 84.1 fL (ref 78.0–100.0)
Platelets: 177 10*3/uL (ref 150–400)
RBC: 3.91 MIL/uL (ref 3.87–5.11)
RDW: 14 % (ref 11.5–15.5)
WBC: 14.5 10*3/uL — AB (ref 4.0–10.5)

## 2015-05-30 LAB — RPR: RPR: NONREACTIVE

## 2015-05-30 NOTE — Progress Notes (Signed)
Post Partum Day 1 Subjective: no complaints, up ad lib, voiding, tolerating PO and + flatus  Objective: Blood pressure 100/62, pulse 61, temperature 97.5 F (36.4 C), temperature source Oral, resp. rate 18, height  (1.549 m), weight 138 lb 6.4 oz (62.778 kg), unknown if currently breastfeeding.  Physical Exam:  General: alert, cooperative and no distress Lochia: appropriate Uterine Fundus: firm Incision: healing well DVT Evaluation: No evidence of DVT seen on physical exam.   Recent Labs  05/29/15 1724 05/30/15 0622  HGB 12.4 10.8*  HCT 37.3 32.9*    Assessment/Plan: Plan for discharge tomorrow   LOS: 1 day   Jo Arnold,Jo Arnold 05/30/2015, 8:43 AM

## 2015-05-30 NOTE — Lactation Note (Signed)
This note was copied from a baby's chart. Lactation Consultation Note  Patient Name: Jo Arnold [id number] Date: 05/30/2015 Reason for consult: Breast/nipple pain;Initial assessment;Other (Comment) (Sore nipples bilaterally , see LC note , )  Baby is 22 hours old and has been to the breast several times.  Per mom the baby recently fed at 1510 for 20 mins and is presently sleeping.  LC encouraged mom to call with feeding cues for [facility] or [facility] RN to assess latch.  Per mom having soreness and cracking at the base of the both nipples. LC assessed breast tissue with moms permission, LC noted  Cracking @ the 1-2 o'clock position of the areola lateral  Aspect of both nipples. LC recommended prior to each latch steps for latching - breast massage, hand express, pre- pump with  Hand pump to make the nipple areola more elastic for a deeper latch, also to use the cross cradle or football position instead of the  transitional cradle hold until the soreness improves and the baby learns to latch with depth. EBM liberally to nipples before feedings , after feedings And in between. Comfort gels after feedings. Due to where the cracking is located the shell may irritate the skin.  LC instructed mom on the use comfort gels and hand pump[ ( #24 Flange a good fit ) .  Mother informed of post-discharge support and given phone number to the lactation department, including services for phone call assistance;  out-patient appointments; and breastfeeding support group. List of other breastfeeding resources in the community given in the handout.  Encouraged mother to call for problems or concerns related to breastfeeding. Jo Arnold aware of LC plan.   Maternal Data Has patient been taught Hand Expression?: Yes  Feeding Feeding Type:  (baby recently breast at 1510 for 20 mins per mom ) Length of feed: 20 min  LATCH Score/Interventions Latch: Repeated attempts needed to sustain latch, nipple  held in mouth throughout feeding, stimulation needed to elicit sucking reflex. Intervention(s): Adjust position  Audible Swallowing: Spontaneous and intermittent  Type of Nipple: Everted at rest and after stimulation  Comfort (Breast/Nipple): Filling, red/small blisters or bruises, mild/mod discomfort  Problem noted: Mild/Moderate discomfort (at start of feedings) Interventions (Mild/moderate discomfort):  (reposition to deeper latch)  Hold (Positioning): No assistance needed to correctly position infant at breast.  LATCH Score: 8  Lactation Tools Discussed/Used Tools: Pump Breast pump type: Manual   Consult Status Consult Status: Follow-up Date: 05/30/15 Follow-up type: In-patient    Kathrin Greathouse 05/30/2015, 3:52 PM

## 2015-05-30 NOTE — H&P (Signed)
Jo Arnold is a 30 y.o. female presenting for UCs. Prenatal care complicated by history of low transverse cesarean section. Patient desires VBAC. Risks reviewed in office. Maternal Medical History:  Reason for admission: Contractions.   Fetal activity: Perceived fetal activity is normal.      OB History    Gravida Para Term Preterm AB TAB SAB Ectopic Multiple Living   0 2     Past Medical History  Diagnosis Date  . No pertinent past medical history   . Hx of varicella   . Hx of pyelonephritis   . Abnormal Pap smear   . Medical history non-contributory   . Anemia 12/2010  . Vaginal Pap smear, abnormal    Past Surgical History  Procedure Laterality Date  . No past surgeries    . Cesarean section N/A 03/09/2013    Procedure: CESAREAN SECTION;  Surgeon: Jeani Hawking, MD;  Location: [facility];  Service: Obstetrics;  Laterality: N/A;   Family History: family history includes Alcohol abuse in her brother; Heart disease in her brother and father; Hyperlipidemia in her mother; Hypertension in her mother; Stroke in her brother and father. Social History:  reports that she has never smoked. She has never used smokeless tobacco. She reports that she does not drink alcohol or use illicit drugs.   Prenatal Transfer Tool  Maternal Diabetes: No Genetic Screening: Normal Maternal Ultrasounds/Referrals: Normal Fetal Ultrasounds or other Referrals:  None Maternal Substance Abuse:  No Significant Maternal Medications:  None Significant Maternal Lab Results:  None Other Comments:  None  Review of Systems  Eyes: Negative for blurred vision.  Gastrointestinal: Negative for abdominal pain.  Neurological: Negative for headaches.    Dilation: 10 Effacement (%): 100 Station: +2, +3 Exam by:: Dr Henderson Cloud Blood pressure 100/62, pulse 61, temperature 97.5 F (36.4 C), temperature source Oral, resp. rate 18, height  (1.549 m), weight 138 lb 6.4 oz (62.778 kg),  unknown if currently breastfeeding. Maternal Exam:  Abdomen: Fetal presentation: vertex     Fetal Exam Fetal State Assessment: Category I - tracings are normal.     Physical Exam  Cardiovascular: Normal rate.   Respiratory: Effort normal.  GI: Soft.  Neurological: She has normal reflexes.    Prenatal labs: ABO, Rh: --/--/O POS (02/24 1724) Antibody: NEG (02/24 1724) Rubella: Immune (07/19 0000) RPR: Non Reactive (02/24 1724)  HBsAg: Negative (07/19 0000)  HIV: Non-reactive (07/19 0000)  GBS: Positive (02/19 1627)   Assessment/Plan: 30 yo G3P1 in active labor   Jo Arnold,Jo Arnold 05/30/2015, 8:37 AM

## 2015-05-30 NOTE — Anesthesia Postprocedure Evaluation (Signed)
Anesthesia Post Note  Patient: Jo Arnold  Procedure(s) Performed: * No procedures listed *  Patient location during evaluation: Mother Baby Anesthesia Type: Epidural Level of consciousness: awake and alert and oriented Pain management: satisfactory to patient Vital Signs Assessment: post-procedure vital signs reviewed and stable Respiratory status: spontaneous breathing and nonlabored ventilation Cardiovascular status: stable Postop Assessment: no headache, no backache, no signs of nausea or vomiting, adequate PO intake and patient able to bend at knees (patient up walking) Anesthetic complications: no    Last Vitals:  Filed Vitals:   05/30/15 0410 05/30/15 0603  BP: 112/52 100/62  Pulse: 62 61  Temp: 36.4 C 36.4 C  Resp: 18 18    Last Pain:  Filed Vitals:   05/30/15 0605  PainSc: 3                  Ahmani Prehn

## 2015-05-31 MED ORDER — IBUPROFEN 600 MG PO TABS: 600.0000 mg | ORAL_TABLET | Freq: Four times a day (QID) | ORAL | Status: DC | PRN

## 2015-05-31 MED ORDER — OXYCODONE-ACETAMINOPHEN 5-325 MG PO TABS: 1.0000 | ORAL_TABLET | Freq: Four times a day (QID) | ORAL | Status: DC | PRN

## 2015-05-31 NOTE — Progress Notes (Signed)
Post Partum Day 2 Subjective: no complaints, up ad lib, voiding, tolerating PO and + flatus  Objective: Blood pressure 111/56, pulse 66, temperature 97.6 F (36.4 C), temperature source Oral, resp. rate 18, height  (1.549 m), weight 138 lb 6.4 oz (62.778 kg), SpO2 100 %, unknown if currently breastfeeding.  Physical Exam:  General: alert, cooperative and no distress Lochia: appropriate Uterine Fundus: firm Incision: healing well DVT Evaluation: No evidence of DVT seen on physical exam.   Recent Labs  05/29/15 1724 05/30/15 0622  HGB 12.4 10.8*  HCT 37.3 32.9*    Assessment/Plan: Discharge home   LOS: 2 days   Jo Arnold,Sefora Tietje E 05/31/2015, 8:41 AM

## 2015-05-31 NOTE — Lactation Note (Signed)
This note was copied from a baby's chart. Lactation Consultation Note  Patient Name: Girl Dillon Mcreynolds [id number] Date: 05/31/2015 Reason for consult: Follow-up assessment  With this mom of a term infant, now 32 hours old, and full term. Mom was breast feeding in cradle hold when I walked in the room, and complaining of painful latch. I had mom reposition her and baby, for cross cradle hold , and showed mom how to maintain a deep latch. The baby opens wide, and flanges easily, Mom was advised to avoid cradle hold, and to continue supporting her breast during the feed, and to hold baby behind her back, as opposed to her head. Mom states this latch felt deeper and more comfortable. Mom has easily expressed milk, that appears transitional. Mom knows to call for questions/concerns.    Maternal Data    Feeding Feeding Type: Breast Fed Length of feed: 10 min  LATCH Score/Interventions Latch: Grasps breast easily, tongue down, lips flanged, rhythmical sucking. Intervention(s): Adjust position;Assist with latch  Audible Swallowing: A few with stimulation  Type of Nipple: Everted at rest and after stimulation  Comfort (Breast/Nipple): Soft / non-tender  Problem noted: Mild/Moderate discomfort;Cracked, bleeding, blisters, bruises Interventions  (Cracked/bleeding/bruising/blister): Expressed breast milk to nipple Interventions (Mild/moderate discomfort): Comfort gels  Hold (Positioning): Assistance needed to correctly position infant at breast and maintain latch. Intervention(s): Breastfeeding basics reviewed;Support Pillows;Position options  LATCH Score: 8  Lactation Tools Discussed/Used     Consult Status Consult Status: Complete Follow-up type: Call as needed    Alfred Levins 05/31/2015, 1:19 PM

## 2015-06-01 NOTE — Discharge Summary (Signed)
Obstetric Discharge Summary Reason for Admission: onset of labor Prenatal Procedures: none Intrapartum Procedures: spontaneous vaginal delivery Postpartum Procedures: none Complications-Operative and Postpartum: none HEMOGLOBIN  Date Value Ref Range Status  05/30/2015 10.8* 12.0 - 15.0 g/dL Final   HCT  Date Value Ref Range Status  05/30/2015 32.9* 36.0 - 46.0 % Final    Physical Exam:  General: alert, cooperative and no distress Lochia: appropriate Uterine Fundus: firm Incision: healing well DVT Evaluation: No evidence of DVT seen on physical exam.  Discharge Diagnoses: Term Pregnancy-delivered  Discharge Information: Date: 06/01/2015 Activity: pelvic rest Diet: routine Medications: PNV, Ibuprofen and Percocet Condition: stable Instructions: refer to practice specific booklet Discharge to: home   Newborn Data: Live born female  Birth Weight: 5 lb 10.5 oz (2565 g) APGAR: 8, 9  Home with mother.  Jo Arnold II,Jo Arnold E 06/01/2015, 7:18 PM

## 2015-12-16 ENCOUNTER — Other Ambulatory Visit: Payer: Self-pay | Admitting: Obstetrics & Gynecology

## 2015-12-16 DIAGNOSIS — N6310 Unspecified lump in the right breast, unspecified quadrant: Secondary | ICD-10-CM

## 2015-12-21 ENCOUNTER — Ambulatory Visit
Admission: RE | Admit: 2015-12-21 | Discharge: 2015-12-21 | Disposition: A | Discharge status: Home / Self Care | Payer: 59 | Source: Ambulatory Visit | Attending: Obstetrics & Gynecology | Admitting: Obstetrics & Gynecology

## 2015-12-21 DIAGNOSIS — N6310 Unspecified lump in the right breast, unspecified quadrant: Secondary | ICD-10-CM

## 2016-01-11 ENCOUNTER — Other Ambulatory Visit: Payer: 59

## 2016-01-25 ENCOUNTER — Encounter: Payer: 59 | Admitting: Physician Assistant

## 2016-01-25 ENCOUNTER — Other Ambulatory Visit: Payer: 59

## 2016-01-25 DIAGNOSIS — Z0000 Encounter for general adult medical examination without abnormal findings: Secondary | ICD-10-CM

## 2016-01-25 LAB — CBC WITH DIFFERENTIAL/PLATELET
BASOS PCT: 0 %
Basophils Absolute: 0 cells/uL (ref 0–200)
EOS ABS: 183 {cells}/uL (ref 15–500)
EOS PCT: 3 %
HCT: 41.7 % (ref 35.0–45.0)
Hemoglobin: 13.7 g/dL (ref 12.0–15.0)
LYMPHS PCT: 30 %
Lymphs Abs: 1830 cells/uL (ref 850–3900)
MCH: 29.3 pg (ref 27.0–33.0)
MCHC: 32.9 g/dL (ref 32.0–36.0)
MCV: 89.1 fL (ref 80.0–100.0)
MONOS PCT: 7 %
MPV: 11.2 fL (ref 7.5–12.5)
Monocytes Absolute: 427 cells/uL (ref 200–950)
NEUTROS ABS: 3660 {cells}/uL (ref 1500–7800)
Neutrophils Relative %: 60 %
PLATELETS: 222 10*3/uL (ref 140–400)
RBC: 4.68 MIL/uL (ref 3.80–5.10)
RDW: 13.4 % (ref 11.0–15.0)
WBC: 6.1 10*3/uL (ref 3.8–10.8)

## 2016-01-25 LAB — TSH: TSH: 0.94 mIU/L

## 2016-01-26 LAB — COMPLETE METABOLIC PANEL WITH GFR
ALT: 11 U/L (ref 6–29)
AST: 17 U/L (ref 10–30)
Albumin: 4.1 g/dL (ref 3.6–5.1)
Alkaline Phosphatase: 70 U/L (ref 33–115)
BILIRUBIN TOTAL: 0.6 mg/dL (ref 0.2–1.2)
BUN: 12 mg/dL (ref 7–25)
CALCIUM: 9.5 mg/dL (ref 8.6–10.2)
CO2: 26 mmol/L (ref 20–31)
CREATININE: 0.67 mg/dL (ref 0.50–1.10)
Chloride: 107 mmol/L (ref 98–110)
GFR, Est Non African American: >89 mL/min (ref >=60)
Glucose, Bld: 83 mg/dL (ref 70–99)
Potassium: 4.7 mmol/L (ref 3.5–5.3)
Sodium: 142 mmol/L (ref 135–146)
TOTAL PROTEIN: 6.7 g/dL (ref 6.1–8.1)

## 2016-01-26 LAB — LIPID PANEL
CHOLESTEROL: 164 mg/dL (ref 125–200)
HDL: 67 mg/dL (ref >=46)
LDL Cholesterol: 89 mg/dL (ref <130)
Total CHOL/HDL Ratio: 2.4 Ratio (ref <=5.0)
Triglycerides: 39 mg/dL (ref <150)
VLDL: 8 mg/dL (ref <30)

## 2016-01-27 ENCOUNTER — Ambulatory Visit (INDEPENDENT_AMBULATORY_CARE_PROVIDER_SITE_OTHER): Payer: 59 | Admitting: Physician Assistant

## 2016-01-27 VITALS — BP 118/60 | HR 90 | Temp 97.9°F | Resp 16 | Ht 59.75 in | Wt 112.0 lb

## 2016-01-27 DIAGNOSIS — Z23 Encounter for immunization: Secondary | ICD-10-CM

## 2016-01-27 DIAGNOSIS — Z0000 Encounter for general adult medical examination without abnormal findings: Secondary | ICD-10-CM | POA: Diagnosis not present

## 2016-01-27 DIAGNOSIS — B86 Scabies: Secondary | ICD-10-CM

## 2016-01-27 MED ORDER — PERMETHRIN 5 % EX CREA: TOPICAL_CREAM | CUTANEOUS | 0 refills | Status: DC

## 2016-01-27 NOTE — Progress Notes (Signed)
Patient ID: Jo Arnold MRN: [id number], DOB: 1985/06/28, 30 y.o. Date of Encounter: 01/27/2016,   Chief Complaint: Physical (CPE)  HPI: 30 y.o. y/o female  here for CPE.   She is originally from [place]. She lived there until age 62.  She is married. Her husband is a patient of mine as well.   She now has 2 children. Her son is 80 years old and now she has a baby girl who is 47 months old.  Says that the only concern she has is that she has been itching all over. Says that her husband and her son are as well. Says that her son has an appointment with the pediatrician tomorrow. She sees very few lesions on her skin or any type of rash and what she does see are scattered. Has a few tiny spots on her distal forearms. There are a couple of spots on her legs. Says that she has also felt a spot up in the back of her neck.  No other complaints or concerns.    Review of Systems: Consitutional: No fever, chills, fatigue, night sweats, lymphadenopathy. No significant/unexplained weight changes. Eyes: No visual changes, eye redness, or discharge. ENT/Mouth: No ear pain, sore throat, nasal drainage, or sinus pain. Cardiovascular: No chest pressure,heaviness, tightness or squeezing, even with exertion. No increased shortness of breath or dyspnea on exertion.No palpitations, edema, orthopnea, PND. Respiratory: No cough, hemoptysis, SOB, or wheezing. Gastrointestinal: No anorexia, dysphagia, reflux, pain, nausea, vomiting, hematemesis, diarrhea, constipation, BRBPR, or melena. Breast: No mass, nodules, bulging, or retraction. No skin changes or inflammation. No nipple discharge. No lymphadenopathy. Genitourinary: No dysuria, hematuria, incontinence, vaginal discharge, pruritis, burning, abnormal bleeding, or pain. Musculoskeletal: No decreased ROM, No joint pain or swelling. No significant pain in neck, back, or extremities. Skin: -See  HPI------------------------------------------------------------------------------------------------------------------------ Neurological: No headache, dizziness, syncope, seizures, tremors, memory loss, coordination problems, or paresthesias. Psychological: No anxiety, depression, hallucinations, SI/HI. Endocrine: No polydipsia, polyphagia, polyuria, or known diabetes.No increased fatigue. No palpitations/rapid heart rate. No significant/unexplained weight change. All other systems were reviewed and are otherwise negative.  Past Medical History:  Diagnosis Date  . Abnormal Pap smear   . Anemia 12/2010  . Hx of pyelonephritis   . Hx of varicella   . Medical history non-contributory   . No pertinent past medical history   . Vaginal Pap smear, abnormal      Past Surgical History:  Procedure Laterality Date  . CESAREAN SECTION N/A 03/09/2013   Procedure: CESAREAN SECTION;  Surgeon: Jeani Hawking, MD;  Location: [facility];  Service: Obstetrics;  Laterality: N/A;  . NO PAST SURGERIES      Home Meds:  Outpatient Medications Prior to Visit  Medication Sig Dispense Refill  . ibuprofen (ADVIL,MOTRIN) 600 MG tablet Take 1 tablet (600 mg total) by mouth every 6 (six) hours as needed. (Patient not taking: Reported on 01/27/2016) 30 tablet 0  . oxyCODONE-acetaminophen (PERCOCET/ROXICET) 5-325 MG tablet Take 1-2 tablets by mouth every 6 (six) hours as needed (pain scale > 7). (Patient not taking: Reported on 01/27/2016) 20 tablet 0  . Prenatal Vit-Fe Fumarate-FA (PRENATAL MULTIVITAMIN) TABS Take 1 tablet by mouth daily.     No facility-administered medications prior to visit.     Allergies: No Known Allergies  Social History   Social History  . Marital status: Married    Spouse name: N/A  . Number of children: N/A  . Years of education: N/A   Occupational History  . Not on  file.   Social History Main Topics  . Smoking status: Never Smoker  . Smokeless tobacco: Never Used  . Alcohol  use No     Comment: before pregnancy  . Drug use: No  . Sexual activity: Yes    Birth control/ protection: None, Condom   Other Topics Concern  . Not on file   Social History Narrative   Married.    2 children--A son, then a daughter   She stays home with baby   She lived in [place] until age 30 y/o   Then lived in [place]   Moved to [place] in 2014    Family History  Problem Relation Age of Onset  . Hypertension Mother   . Hyperlipidemia Mother   . Heart disease Father     bypass age 30  . Stroke Father   . Heart disease Brother     bypass age 30  . Alcohol abuse Brother   . Stroke Brother     Physical Exam: Blood pressure 118/60, pulse 90, temperature 97.9 F (36.6 C), temperature source Oral, resp. rate 16, height 4' 11.75" (1.518 m), weight 112 lb (50.8 kg), SpO2 99 %, unknown if currently breastfeeding., Body mass index is 22.06 kg/m. General: Well developed, well nourished, WF. Appears in no acute distress. HEENT: Normocephalic, atraumatic. Conjunctiva pink, sclera non-icteric. Pupils 2 mm constricting to 1 mm, round, regular, and equally reactive to light and accomodation. EOMI. Internal auditory canal clear. TMs with good cone of light and without pathology. Nasal mucosa pink. Nares are without discharge. No sinus tenderness. Oral mucosa pink.  Pharynx without exudate.   Neck: Supple. Trachea midline. No thyromegaly. Full ROM. No lymphadenopathy.No Carotid Bruits. Lungs: Clear to auscultation bilaterally without wheezes, rales, or rhonchi. Breathing is of normal effort and unlabored. Cardiovascular: RRR with S1 S2. No murmurs, rubs, or gallops. Distal pulses 2+ symmetrically. No carotid or abdominal bruits. Breast: Deferred. She sees Gyn Abdomen: Soft, non-tender, non-distended with normoactive bowel sounds. No hepatosplenomegaly or masses. No rebound/guarding. No CVA tenderness. No hernias.  Genitourinary: Deferred. Sees Gyn. Musculoskeletal: Full range of  motion and 5/5 strength throughout. Skin: She has few tiny ~` 1mm papules on distal forearm right, a few tiny papules on lower leg/shin--scattered. Left chest with area that she says is very itchy but no real lesion visible. Neuro: A+Ox3. CN II-XII grossly intact. Moves all extremities spontaneously. Full sensation throughout. Normal gait. DTR 2+ throughout upper and lower extremities.  Psych:  Responds to questions appropriately with a normal affect.   Assessment/Plan:  30 y.o. y/o female here for CPE  1. Visit for preventive health examination  A. Screening Labs:  She recently came fasting for labs. Reviewed those results with her today. All labs were normal.  B. Pap: Per Gyn  C. Immunizations:  Influenza: She agrees to get influenza vaccine today. Influenza vaccine given here 01/27/2016 Tetanus: She says it has been 10 years since her last tetanus vaccine. She is agreeable to get this today. T dap given here 01/27/2016     2. Scabies - permethrin (ACTICIN) 5 % cream; Massage cream into entire body (avoid mouth, eyes, nose), wash off after 8-14 hours. Repeat in 7 days. Typical adult dose is 30 g. Use one half of the supply on the first treatment and the other half of the supply when due to repeat treatment in 7 days.  Dispense: 60 g; Refill: 0  Discussed the cause of this with patient and discussed appropriate/complete treatment  with patient at length. She voices understanding and agrees. Her husband is a patient of mine who I just saw a couple days ago. Therefore printed a prescription for this medication for him as well as for her. She has an appointment for her children to see the pediatrician tomorrow. Discussed that they all need to do the treatment at the same time. Also at that same time she is going to need to wash all bedding pillowcases Towels clothing that his been worn. All of this needs to be washed and then dried on high heat. They need to repeat treatment in 7  days.  3. Flu vaccine need - Flu Vaccine QUAD 36+ mos IM  4. Need for diphtheria-tetanus-pertussis (Tdap) vaccine, adult/adolescent - Tdap vaccine greater than or equal to 7yo IM  Signed, [address] [place], [place], [facility] 01/27/2016 9:06 AM

## 2016-04-27 ENCOUNTER — Ambulatory Visit (INDEPENDENT_AMBULATORY_CARE_PROVIDER_SITE_OTHER): Payer: 59 | Admitting: Physician Assistant

## 2016-04-27 ENCOUNTER — Encounter: Payer: Self-pay | Admitting: Physician Assistant

## 2016-04-27 VITALS — BP 100/60 | HR 81 | Temp 97.7°F | Resp 16 | Wt 110.6 lb

## 2016-04-27 DIAGNOSIS — L720 Epidermal cyst: Secondary | ICD-10-CM

## 2016-04-27 NOTE — Progress Notes (Signed)
Patient ID: Jo Arnold MRN: [id number], DOB: 09/30/1985, 30 y.o. Date of Encounter: 04/27/2016, 11:25 AM    Chief Complaint:  Chief Complaint  Patient presents with  . lump on stomach    x4days     HPI: 31 y.o. year old female presents with above.   Says that just a few days ago she noticed this area on her right abdomen that looks like a bruise.  Says that when she was feeling that area she also felt a little firm knot under there. Therefore scheduled this visit for evaluation. No other complaints or concerns. Says that she has had cyst on her breast in the past. No other known skin lesions/skin masses. She has had no other areas of abnormal bruising. She has a child with her today who appears to be about 31-year-old. She says that she also has a 31-year-old.Says iIt is very possible that either of them may have been kinda standing on her abdomen/pushed on it with their hands or feet-- to have caused this bruise.     Home Meds:   Outpatient Medications Prior to Visit  Medication Sig Dispense Refill  . ibuprofen (ADVIL,MOTRIN) 600 MG tablet Take 1 tablet (600 mg total) by mouth every 6 (six) hours as needed. (Patient not taking: Reported on 01/27/2016) 30 tablet 0  . oxyCODONE-acetaminophen (PERCOCET/ROXICET) 5-325 MG tablet Take 1-2 tablets by mouth every 6 (six) hours as needed (pain scale > 7). (Patient not taking: Reported on 01/27/2016) 20 tablet 0  . permethrin (ACTICIN) 5 % cream Massage cream into entire body (avoid mouth, eyes, nose), wash off after 8-14 hours. Repeat in 7 days. Typical adult dose is 30 g. Use one half of the supply on the first treatment and the other half of the supply when due to repeat treatment in 7 days. (Patient not taking: Reported on 04/27/2016) 60 g 0  . Prenatal Vit-Fe Fumarate-FA (PRENATAL MULTIVITAMIN) TABS Take 1 tablet by mouth daily.     No facility-administered medications prior to visit.     Allergies: No Known Allergies     Review of Systems: See HPI for pertinent ROS. All other ROS negative.    Physical Exam: Blood pressure 100/60, pulse 81, temperature 97.7 F (36.5 C), temperature source Oral, resp. rate 16, weight 110 lb 9.6 oz (50.2 kg), last menstrual period 03/20/2016, SpO2 98 %, currently breastfeeding., Body mass index is 21.78 kg/m. General:  WNWD Female--Very lean with no abdominal fat. Appears in no acute distress. Neck: Supple. No thyromegaly. No lymphadenopathy. Lungs: Clear bilaterally to auscultation without wheezes, rales, or rhonchi. Breathing is unlabored. Heart: Regular rhythm. No murmurs, rubs, or gallops. Abdomen:  At level of umbilicus, 6.5 cm to the right of the umbilicus---- There is ~ 1/2 inch diameter area of yellow ecchymosis.  In the center of this--there is 0.25 cm diameter mobile firm nodule. Can feel all edges of this nodule--it is round, smooth, mobile. Msk:  Strength and tone normal for age. Extremities/Skin: Warm and dry.  Neuro: Alert and oriented X 3. Moves all extremities spontaneously. Gait is normal. CNII-XII grossly in tact. Psych:  Responds to questions appropriately with a normal affect.     ASSESSMENT AND PLAN:  31 y.o. year old female with    1. Epidermoid cyst Reassured her that finding is consistent with a benign cyst or possibly even lipoma. Think it once again that she has the bruising at that same site. Very likely at the bruising can be caused by  her children. Because she was looking at the bruising she noticed this other "mass'. Recommend that she leave the site alone and stop pressing on it. If bruising not resolving in 1 week in follow-up. If size of nodule increases at all then follow-up.   Signed, [address] [place], [place], [facility] 04/27/2016 11:25 AM

## 2016-12-23 DIAGNOSIS — L237 Allergic contact dermatitis due to plants, except food: Secondary | ICD-10-CM | POA: Diagnosis not present

## 2016-12-23 DIAGNOSIS — Z23 Encounter for immunization: Secondary | ICD-10-CM | POA: Diagnosis not present

## 2017-03-24 ENCOUNTER — Other Ambulatory Visit: Payer: Self-pay | Admitting: Family Medicine

## 2017-03-24 ENCOUNTER — Other Ambulatory Visit: Payer: 59

## 2017-03-24 DIAGNOSIS — Z0000 Encounter for general adult medical examination without abnormal findings: Secondary | ICD-10-CM

## 2017-03-24 LAB — CBC WITH DIFFERENTIAL/PLATELET
BASOS ABS: 51 {cells}/uL (ref 0–200)
Basophils Relative: 0.6 %
Eosinophils Absolute: 119 cells/uL (ref 15–500)
Eosinophils Relative: 1.4 %
HEMATOCRIT: 42.3 % (ref 35.0–45.0)
Hemoglobin: 14.3 g/dL (ref 11.7–15.5)
LYMPHS ABS: 1326 {cells}/uL (ref 850–3900)
MCH: 29.9 pg (ref 27.0–33.0)
MCHC: 33.8 g/dL (ref 32.0–36.0)
MCV: 88.5 fL (ref 80.0–100.0)
MPV: 11 fL (ref 7.5–12.5)
Monocytes Relative: 5.3 %
NEUTROS PCT: 77.1 %
Neutro Abs: 6554 cells/uL (ref 1500–7800)
Platelets: 206 10*3/uL (ref 140–400)
RBC: 4.78 10*6/uL (ref 3.80–5.10)
RDW: 11.7 % (ref 11.0–15.0)
Total Lymphocyte: 15.6 %
WBC: 8.5 10*3/uL (ref 3.8–10.8)
WBCMIX: 451 {cells}/uL (ref 200–950)

## 2017-03-24 LAB — COMPLETE METABOLIC PANEL WITH GFR
AG RATIO: 1.8 (calc) (ref 1.0–2.5)
ALT: 14 U/L (ref 6–29)
AST: 17 U/L (ref 10–30)
Albumin: 4.4 g/dL (ref 3.6–5.1)
Alkaline phosphatase (APISO): 65 U/L (ref 33–115)
BILIRUBIN TOTAL: 0.9 mg/dL (ref 0.2–1.2)
BUN: 10 mg/dL (ref 7–25)
CALCIUM: 9.5 mg/dL (ref 8.6–10.2)
CHLORIDE: 104 mmol/L (ref 98–110)
CO2: 27 mmol/L (ref 20–32)
Creat: 0.7 mg/dL (ref 0.50–1.10)
GFR, EST NON AFRICAN AMERICAN: 115 mL/min/{1.73_m2} (ref >=60)
GFR, Est African American: 134 mL/min/{1.73_m2} (ref >=60)
GLUCOSE: 72 mg/dL (ref 65–99)
Globulin: 2.4 g/dL (calc) (ref 1.9–3.7)
POTASSIUM: 4.2 mmol/L (ref 3.5–5.3)
Sodium: 140 mmol/L (ref 135–146)
Total Protein: 6.8 g/dL (ref 6.1–8.1)

## 2017-03-24 LAB — LIPID PANEL
Cholesterol: 167 mg/dL (ref <200)
HDL: 58 mg/dL (ref >50)
LDL Cholesterol (Calc): 97 mg/dL (calc)
Non-HDL Cholesterol (Calc): 109 mg/dL (calc) (ref <130)
Total CHOL/HDL Ratio: 2.9 (calc) (ref <5.0)
Triglycerides: 41 mg/dL (ref <150)

## 2017-03-30 ENCOUNTER — Ambulatory Visit (INDEPENDENT_AMBULATORY_CARE_PROVIDER_SITE_OTHER): Payer: 59 | Admitting: Physician Assistant

## 2017-03-30 ENCOUNTER — Encounter: Payer: Self-pay | Admitting: Physician Assistant

## 2017-03-30 ENCOUNTER — Other Ambulatory Visit: Payer: Self-pay

## 2017-03-30 VITALS — BP 108/60 | HR 91 | Temp 97.7°F | Resp 18 | Ht 62.25 in | Wt 110.2 lb

## 2017-03-30 DIAGNOSIS — Z0000 Encounter for general adult medical examination without abnormal findings: Secondary | ICD-10-CM | POA: Diagnosis not present

## 2017-03-30 NOTE — Progress Notes (Signed)
Patient ID: Jo Arnold MRN: [id number], DOB: 07/07/1985, 31 y.o. Date of Encounter: 03/30/2017,   Chief Complaint: Physical (CPE)  HPI: 31 y.o. y/o female  here for CPE.   She is originally from [place]. She lived there until age 31.  She is married. Her husband is a patient of mine as well.   She now has 2 children. Her son is 31 years old and her daughter will turn 31 y/o in February.  Continues to see gynecologist annually.  States that she has appointment there in January. Has no specific concerns to address today.   Review of Systems: Consitutional: No fever, chills, fatigue, night sweats, lymphadenopathy. No significant/unexplained weight changes. Eyes: No visual changes, eye redness, or discharge. ENT/Mouth: No ear pain, sore throat, nasal drainage, or sinus pain. Cardiovascular: No chest pressure,heaviness, tightness or squeezing, even with exertion. No increased shortness of breath or dyspnea on exertion.No palpitations, edema, orthopnea, PND. Respiratory: No cough, hemoptysis, SOB, or wheezing. Gastrointestinal: No anorexia, dysphagia, reflux, pain, nausea, vomiting, hematemesis, diarrhea, constipation, BRBPR, or melena. Breast: No mass, nodules, bulging, or retraction. No skin changes or inflammation. No nipple discharge. No lymphadenopathy. Genitourinary: No dysuria, hematuria, incontinence, vaginal discharge, pruritis, burning, abnormal bleeding, or pain. Musculoskeletal: No decreased ROM, No joint pain or swelling. No significant pain in neck, back, or extremities. Skin: No rash or concerning skin lesions Neurological: No headache, dizziness, syncope, seizures, tremors, memory loss, coordination problems, or paresthesias. Psychological: No anxiety, depression, hallucinations, SI/HI. Endocrine: No polydipsia, polyphagia, polyuria, or known diabetes.No increased fatigue. No palpitations/rapid heart rate. No significant/unexplained weight change. All other systems  were reviewed and are otherwise negative.  Past Medical History:  Diagnosis Date  . Abnormal Pap smear   . Anemia 12/2010  . Hx of pyelonephritis   . Hx of varicella   . Medical history non-contributory   . No pertinent past medical history   . Vaginal Pap smear, abnormal      Past Surgical History:  Procedure Laterality Date  . CESAREAN SECTION N/A 03/09/2013   Procedure: CESAREAN SECTION;  Surgeon: Jeani HawkingMichelle L Grewal, MD;  Location: [facility];  Service: Obstetrics;  Laterality: N/A;  . NO PAST SURGERIES      Home Meds:  Outpatient Medications Prior to Visit  Medication Sig Dispense Refill  . ibuprofen (ADVIL,MOTRIN) 600 MG tablet Take 1 tablet (600 mg total) by mouth every 6 (six) hours as needed. (Patient not taking: Reported on 01/27/2016) 30 tablet 0  . oxyCODONE-acetaminophen (PERCOCET/ROXICET) 5-325 MG tablet Take 1-2 tablets by mouth every 6 (six) hours as needed (pain scale > 7). (Patient not taking: Reported on 01/27/2016) 20 tablet 0  . permethrin (ACTICIN) 5 % cream Massage cream into entire body (avoid mouth, eyes, nose), wash off after 8-14 hours. Repeat in 7 days. Typical adult dose is 30 g. Use one half of the supply on the first treatment and the other half of the supply when due to repeat treatment in 7 days. (Patient not taking: Reported on 04/27/2016) 60 g 0  . Prenatal Vit-Fe Fumarate-FA (PRENATAL MULTIVITAMIN) TABS Take 1 tablet by mouth daily.     No facility-administered medications prior to visit.     Allergies: No Known Allergies  Social History   Social History  . Marital status: Married    Spouse name: N/A  . Number of children: N/A  . Years of education: N/A   Occupational History  . Not on file.   Social History Main Topics  .  Smoking status: Never Smoker  . Smokeless tobacco: Never Used  . Alcohol use No     Comment: before pregnancy  . Drug use: No  . Sexual activity: Yes    Birth control/ protection: None, Condom   Other Topics Concern    . Not on file   Social History Narrative   Married.    2 children--A son, then a daughter   She stays home with baby   She lived in [place] until age 89 y/o   Then lived in [place]   [place]oved [place] in [place]    Family History  Problem Relation Age of Onset  . Hypertension Mother   . Hyperlipidemia Mother   . Heart disease Father        bypass age 31  . Stroke Father   . Heart disease Brother        bypass age 31  . Alcohol abuse Brother   . Stroke Brother     Physical Exam: Blood pressure 108/60, pulse 91, temperature 97.7 F (36.5 C), temperature source Oral, resp. rate 18, height 5' 2.25" (1.581 m), weight 50 kg (110 lb 3.2 oz), last menstrual period 03/20/2017, SpO2 98 %, currently breastfeeding., Body mass index is 19.99 kg/m. General: Well developed, well nourished, WF. Appears in no acute distress. HEENT: Normocephalic, atraumatic. Conjunctiva pink, sclera non-icteric. Pupils 2 mm constricting to 1 mm, round, regular, and equally reactive to light and accomodation. EOMI. Internal auditory canal clear. TMs with good cone of light and without pathology. Nasal mucosa pink. Nares are without discharge. No sinus tenderness. Oral mucosa pink.  Pharynx without exudate.   Neck: Supple. Trachea midline. No thyromegaly. Full ROM. No lymphadenopathy.No Carotid Bruits. Lungs: Clear to auscultation bilaterally without wheezes, rales, or rhonchi. Breathing is of normal effort and unlabored. Cardiovascular: RRR with S1 S2. No murmurs, rubs, or gallops. Distal pulses 2+ symmetrically. No carotid or abdominal bruits. Breast: Deferred. She sees Gyn Abdomen: Soft, non-tender, non-distended with normoactive bowel sounds. No hepatosplenomegaly or masses. No rebound/guarding. No CVA tenderness. No hernias.  Genitourinary: Deferred. Sees Gyn. Musculoskeletal: Full range of motion and 5/5 strength throughout. Skin: Warm and dry. No rash. Neuro: A+Ox3. CN II-XII grossly intact. Moves all  extremities spontaneously. Full sensation throughout. Normal gait.  Psych:  Responds to questions appropriately with a normal affect.   Assessment/Plan:  31 y.o. y/o female here for CPE  1. Visit for preventive health examination  A. Screening Labs:  She recently came fasting for labs. Reviewed those results with her today. All labs were normal.  B. Pap: Per Gyn  C. Immunizations:  Influenza: She reports she already received Flu vaccine in October. Tetanus:  T dap given here 01/27/2016    SignedFrazier Richards, Adrick Kestler Beth Izacc Demeyer, [place], [facility] 03/30/2017 8:53 AM

## 2017-07-30 DIAGNOSIS — L237 Allergic contact dermatitis due to plants, except food: Secondary | ICD-10-CM | POA: Diagnosis not present

## 2017-10-07 DIAGNOSIS — L237 Allergic contact dermatitis due to plants, except food: Secondary | ICD-10-CM | POA: Diagnosis not present

## 2017-11-13 ENCOUNTER — Other Ambulatory Visit: Payer: Self-pay

## 2017-11-13 ENCOUNTER — Encounter: Payer: Self-pay | Admitting: Physician Assistant

## 2017-11-13 ENCOUNTER — Ambulatory Visit: Payer: 59 | Admitting: Physician Assistant

## 2017-11-13 VITALS — BP 104/62 | HR 74 | Temp 98.6°F | Ht 62.25 in | Wt 110.0 lb

## 2017-11-13 DIAGNOSIS — L239 Allergic contact dermatitis, unspecified cause: Secondary | ICD-10-CM

## 2017-11-13 MED ORDER — METHYLPREDNISOLONE ACETATE 80 MG/ML IJ SUSP
80.0000 mg | Freq: Once | INTRAMUSCULAR | Status: AC
  Administered 2017-11-13: 80 mg via INTRAMUSCULAR

## 2017-11-13 NOTE — Progress Notes (Signed)
    Patient ID: Jo Arnold MRN: [id number], DOB: 05/28/1985, 32 y.o. Date of Encounter: 11/13/2017, 4:40 PM    Chief Complaint:  Chief Complaint  Patient presents with  . Rash     HPI: 32 y.o. year old female presents with above.  She reports [facility] and they told her it was poison oak/poison ivy.  Treated her with an oral prednisone taper.  States that 2 weeks later the rash and itching were cleared.  However since then has been developing same type of rash again.  Says that several days ago she noticed a few itchy bumps on her arms.  Now has itchy vesicles in the webs of her fingers.  Has area of itchy rash on her left cheek of her face and the right side of her neck.  Also scattered papules at her ankles and feet and on anterior aspect of her knees.   Says that [facility], she had some linear distribution along the right thigh.  Notes that she does a lot of hiking.  Says that she usually hikes twice a week.  No other concerns to address today.     Home Meds:   No outpatient medications prior to visit.   No facility-administered medications prior to visit.     Allergies: No Known Allergies    Review of Systems: See HPI for pertinent ROS. All other ROS negative.    Physical Exam: Blood pressure 104/62, pulse 74, temperature 98.6 F (37 C), temperature source Oral, height 5' 2.25" (1.581 m), weight 49.9 kg, SpO2 97 %, currently breastfeeding., Body mass index is 19.96 kg/m. General:  WNWD Female. Appears in no acute distress. Neck: Supple. No thyromegaly. No lymphadenopathy. Lungs: Clear bilaterally to auscultation without wheezes, rales, or rhonchi. Breathing is unlabored. Heart: Regular rhythm. No murmurs, rubs, or gallops. Msk:  Strength and tone normal for age. Skin: She has vessicles in web space of finger. She has papules, vessicle scattered on arms and lower  legs. She has splotchy rash on left cheek of face and on right neck.  Neuro: Alert and oriented X 3. Moves all extremities spontaneously. Gait is normal. CNII-XII grossly in tact. Psych:  Responds to questions appropriately with a normal affect.     ASSESSMENT AND PLAN:  32 y.o. year old female with   1. Allergic dermatitis To make sure to completely clear this,  will treat with Depo-Medrol 80 mg IM. She is to follow-up if this episode of itching and rash do not completely resolve. Discussed that there is no other treatment.  Discussed prevention--- wearing clothing, preventing contact with poison oak/poison ivy/sumac. Follow up if needed.   [id number]igned, Mary Beth ElbaDixon, [place], [facility] 11/13/2017 4:40 PM

## 2017-11-13 NOTE — Progress Notes (Signed)
AMv

## 2018-01-08 DIAGNOSIS — Z23 Encounter for immunization: Secondary | ICD-10-CM | POA: Diagnosis not present

## 2018-02-05 DIAGNOSIS — Z6820 Body mass index (BMI) 20.0-20.9, adult: Secondary | ICD-10-CM | POA: Diagnosis not present

## 2018-02-05 DIAGNOSIS — Z01419 Encounter for gynecological examination (general) (routine) without abnormal findings: Secondary | ICD-10-CM | POA: Diagnosis not present

## 2018-05-14 ENCOUNTER — Encounter: Payer: Self-pay | Admitting: Family Medicine

## 2018-05-14 ENCOUNTER — Ambulatory Visit (INDEPENDENT_AMBULATORY_CARE_PROVIDER_SITE_OTHER): Payer: 59 | Admitting: Family Medicine

## 2018-05-14 VITALS — BP 100/62 | HR 72 | Temp 98.5°F | Resp 14 | Ht 61.5 in | Wt 108.0 lb

## 2018-05-14 DIAGNOSIS — Z8249 Family history of ischemic heart disease and other diseases of the circulatory system: Secondary | ICD-10-CM | POA: Diagnosis not present

## 2018-05-14 DIAGNOSIS — Z0000 Encounter for general adult medical examination without abnormal findings: Secondary | ICD-10-CM

## 2018-05-14 MED ORDER — CYCLOBENZAPRINE HCL 10 MG PO TABS: 10.0000 mg | ORAL_TABLET | Freq: Three times a day (TID) | ORAL | 0 refills | Status: DC | PRN

## 2018-05-14 NOTE — Progress Notes (Signed)
Subjective:    Patient ID: Jo Arnold, female    DOB: 05/15/1985, 33 y.o.   MRN: [id number]  HPI  Patient is a very pleasant 33 year old female here today for complete physical exam.  She sees her gynecologist for her breast exam and her Pap smear.  She is here today for a checkup.  However she reports that over the last 2 to 3 months she has been dealing with headaches almost on a daily basis.  They usually occur at night around 8:00.  She is taking 1-2 Tylenol every night to get the headaches to go away.  They are usually either located in the occiput or on either temple.  They are described as pressure-like sensation.  She denies any pulsing sensation.  She denies any photophobia or phonophobia.  She denies any nausea vomiting or dizziness.  She denies any blurry vision or vision changes.  She denies any other neurologic deficits associated with a headache.  She denies any recent head trauma/concussion.  She denies any dizziness, balance issues, frequent falls, or other signs of increased intracranial pressure.  She does report that stress may be a contributing factor.  She works at home and also is a full-time mother to 2 children age 77 and 32.  She is under quite a bit of stress from this during the day.  On all usually makes the headache go away  Past Medical History:  Diagnosis Date  . Abnormal Pap smear   . Anemia 12/2010  . Hx of pyelonephritis   . Hx of varicella   . Medical history non-contributory   . No pertinent past medical history   . Vaginal Pap smear, abnormal    Past Surgical History:  Procedure Laterality Date  . CESAREAN SECTION N/A 03/09/2013   Procedure: CESAREAN SECTION;  Surgeon: Jeani Hawking, MD;  Location: [facility];  Service: Obstetrics;  Laterality: N/A;  . NO PAST SURGERIES     No current outpatient medications on file prior to visit.   No current facility-administered medications on file prior to visit.    No Known Allergies Social History    Socioeconomic History  . Marital status: Married    Spouse name: Not on file  . Number of children: Not on file  . Years of education: Not on file  . Highest education level: Not on file  Occupational History  . Not on file  Social Needs  . Financial resource strain: Not on file  . Food insecurity:    Worry: Not on file    Inability: Not on file  . Transportation needs:    Medical: Not on file    Non-medical: Not on file  Tobacco Use  . Smoking status: Never Smoker  . Smokeless tobacco: Never Used  Substance and Sexual Activity  . Alcohol use: No    Comment: before pregnancy  . Drug use: No  . Sexual activity: Yes    Birth control/protection: None, Condom  Lifestyle  . Physical activity:    Days per week: Not on file    Minutes per session: Not on file  . Stress: Not on file  Relationships  . Social connections:    Talks on phone: Not on file    Gets together: Not on file    Attends religious service: Not on file    Active member of club or organization: Not on file    Attends meetings of clubs or organizations: Not on file    Relationship status:  Not on file  . Intimate partner violence:    Fear of current or ex partner: Not on file    Emotionally abused: Not on file    Physically abused: Not on file    Forced sexual activity: Not on file  Other Topics Concern  . Not on file  Social History Narrative   Married.    1 child--son-- as of 09/2013- he is 536 months old   She stays home with baby   She lived in [place] until age 33 y/o   Then lived in [place]   [place]   Family History  Problem Relation Age of Onset  . Hypertension Mother   . Hyperlipidemia Mother   . Heart disease Father        bypass age 33  . Stroke Father   . Heart disease Brother        bypass age 33  . Alcohol abuse Brother   . Stroke Brother      Review of Systems  All other systems reviewed and are negative.      Objective:   Physical Exam Vitals signs  reviewed.  Constitutional:      General: She is not in acute distress.    Appearance: Normal appearance. She is normal weight. She is not ill-appearing, toxic-appearing or diaphoretic.  HENT:     Head: Normocephalic and atraumatic.     Right Ear: Tympanic membrane, ear canal and external ear normal. There is no impacted cerumen.     Left Ear: Tympanic membrane, ear canal and external ear normal. There is no impacted cerumen.     Nose: Nose normal. No congestion or rhinorrhea.     Mouth/Throat:     Mouth: Mucous membranes are moist.     Pharynx: Oropharynx is clear. No oropharyngeal exudate or posterior oropharyngeal erythema.  Eyes:     General: No scleral icterus.       Right eye: No discharge.        Left eye: No discharge.     Extraocular Movements: Extraocular movements intact.     Conjunctiva/sclera: Conjunctivae normal.     Pupils: Pupils are equal, round, and reactive to light.  Neck:     Musculoskeletal: Normal range of motion and neck supple. No neck rigidity.     Vascular: No carotid bruit.  Cardiovascular:     Rate and Rhythm: Normal rate and regular rhythm.     Pulses: Normal pulses.     Heart sounds: Normal heart sounds. No murmur. No friction rub. No gallop.   Pulmonary:     Effort: Pulmonary effort is normal. No respiratory distress.     Breath sounds: Normal breath sounds. No stridor. No wheezing, rhonchi or rales.  Chest:     Chest wall: No tenderness.  Abdominal:     General: Abdomen is flat. Bowel sounds are normal. There is no distension.     Palpations: Abdomen is soft. There is no mass.     Tenderness: There is no abdominal tenderness. There is no right CVA tenderness, left CVA tenderness, guarding or rebound.     Hernia: No hernia is present.  Musculoskeletal:     Right lower leg: No edema.     Left lower leg: No edema.  Lymphadenopathy:     Cervical: No cervical adenopathy.  Skin:    General: Skin is warm.     Coloration: Skin is not jaundiced or  pale.     Findings: No  bruising, erythema, lesion or rash.  Neurological:     General: No focal deficit present.     Mental Status: She is alert and oriented to person, place, and time. Mental status is at baseline.     Cranial Nerves: No cranial nerve deficit.     Sensory: No sensory deficit.     Motor: No weakness.     Coordination: Coordination normal.     Gait: Gait normal.     Deep Tendon Reflexes: Reflexes normal.  Psychiatric:        Mood and Affect: Mood normal.        Behavior: Behavior normal.        Thought Content: Thought content normal.        Judgment: Judgment normal.           Assessment & Plan:  Physical exam, annual - Plan: CBC with Differential/Platelet, COMPLETE METABOLIC PANEL WITH GFR, Lipid panel  Family history of early CAD - Plan: CBC with Differential/Platelet, COMPLETE METABOLIC PANEL WITH GFR, Lipid panel  Physical exam is completely normal.  Neurologic exam is completely normal.  There is no papilledema on funduscopic examination.  There is no neurologic deficit on exam.  I believe the patient is dealing with chronic daily tension headache.  I would like her to try Flexeril 1/2 to 1 tablet at night as needed headache.  If the muscle relaxer helps the headaches, I would focus on management of muscle tension headaches to include yoga, meditation, and daily exercise in the afternoon as a physical outlet for stress relief to help prevent these headaches in the future.  If the headaches worsen, I would proceed with cranial imaging to rule out increased intracranial pressure however I do believe these are more likely tension headaches and less likely migraines.  If necessary consider preventative medication such as gabapentin or Topamax.  Check CBC, CMP, fasting lipid panel.  Flu shot and tetanus shot are up-to-date

## 2018-05-15 ENCOUNTER — Other Ambulatory Visit: Payer: 59

## 2018-05-15 DIAGNOSIS — Z8249 Family history of ischemic heart disease and other diseases of the circulatory system: Secondary | ICD-10-CM | POA: Diagnosis not present

## 2018-05-15 DIAGNOSIS — Z0000 Encounter for general adult medical examination without abnormal findings: Secondary | ICD-10-CM | POA: Diagnosis not present

## 2018-05-16 LAB — COMPLETE METABOLIC PANEL WITH GFR
AG Ratio: 1.7 (calc) (ref 1.0–2.5)
ALBUMIN MSPROF: 4.3 g/dL (ref 3.6–5.1)
ALT: 14 U/L (ref 6–29)
AST: 18 U/L (ref 10–30)
Alkaline phosphatase (APISO): 55 U/L (ref 31–125)
BILIRUBIN TOTAL: 0.6 mg/dL (ref 0.2–1.2)
BUN: 11 mg/dL (ref 7–25)
CHLORIDE: 107 mmol/L (ref 98–110)
CO2: 25 mmol/L (ref 20–32)
CREATININE: 0.75 mg/dL (ref 0.50–1.10)
Calcium: 9.6 mg/dL (ref 8.6–10.2)
GFR, EST AFRICAN AMERICAN: 122 mL/min/{1.73_m2} (ref >=60)
GFR, Est Non African American: 105 mL/min/{1.73_m2} (ref >=60)
GLUCOSE: 75 mg/dL (ref 65–99)
Globulin: 2.5 g/dL (calc) (ref 1.9–3.7)
Potassium: 4.5 mmol/L (ref 3.5–5.3)
Sodium: 140 mmol/L (ref 135–146)
TOTAL PROTEIN: 6.8 g/dL (ref 6.1–8.1)

## 2018-05-16 LAB — CBC WITH DIFFERENTIAL/PLATELET
ABSOLUTE MONOCYTES: 322 {cells}/uL (ref 200–950)
BASOS PCT: 0.9 %
Basophils Absolute: 41 cells/uL (ref 0–200)
EOS ABS: 92 {cells}/uL (ref 15–500)
Eosinophils Relative: 2 %
HCT: 41.4 % (ref 35.0–45.0)
HEMOGLOBIN: 13.6 g/dL (ref 11.7–15.5)
Lymphs Abs: 1297 cells/uL (ref 850–3900)
MCH: 29.5 pg (ref 27.0–33.0)
MCHC: 32.9 g/dL (ref 32.0–36.0)
MCV: 89.8 fL (ref 80.0–100.0)
MPV: 11.5 fL (ref 7.5–12.5)
Monocytes Relative: 7 %
NEUTROS ABS: 2847 {cells}/uL (ref 1500–7800)
Neutrophils Relative %: 61.9 %
Platelets: 215 10*3/uL (ref 140–400)
RBC: 4.61 10*6/uL (ref 3.80–5.10)
RDW: 11.9 % (ref 11.0–15.0)
Total Lymphocyte: 28.2 %
WBC: 4.6 10*3/uL (ref 3.8–10.8)

## 2018-05-16 LAB — LIPID PANEL
CHOL/HDL RATIO: 2.9 (calc) (ref <5.0)
CHOLESTEROL: 144 mg/dL (ref <200)
HDL: 50 mg/dL (ref >=50)
LDL CHOLESTEROL (CALC): 85 mg/dL
Non-HDL Cholesterol (Calc): 94 mg/dL (calc) (ref <130)
TRIGLYCERIDES: 31 mg/dL (ref <150)

## 2018-07-09 DIAGNOSIS — N6310 Unspecified lump in the right breast, unspecified quadrant: Secondary | ICD-10-CM | POA: Diagnosis not present

## 2018-07-11 ENCOUNTER — Other Ambulatory Visit: Payer: Self-pay | Admitting: Obstetrics & Gynecology

## 2018-07-11 DIAGNOSIS — N6310 Unspecified lump in the right breast, unspecified quadrant: Secondary | ICD-10-CM

## 2018-07-25 DIAGNOSIS — L237 Allergic contact dermatitis due to plants, except food: Secondary | ICD-10-CM | POA: Diagnosis not present

## 2018-08-06 ENCOUNTER — Other Ambulatory Visit: Payer: Self-pay

## 2018-08-06 ENCOUNTER — Ambulatory Visit
Admission: RE | Admit: 2018-08-06 | Discharge: 2018-08-06 | Disposition: A | Discharge status: Home / Self Care | Payer: 59 | Source: Ambulatory Visit | Attending: Obstetrics & Gynecology | Admitting: Obstetrics & Gynecology

## 2018-08-06 DIAGNOSIS — N6310 Unspecified lump in the right breast, unspecified quadrant: Secondary | ICD-10-CM

## 2019-01-01 ENCOUNTER — Ambulatory Visit (INDEPENDENT_AMBULATORY_CARE_PROVIDER_SITE_OTHER): Payer: 59

## 2019-01-01 ENCOUNTER — Other Ambulatory Visit: Payer: Self-pay

## 2019-01-01 DIAGNOSIS — Z23 Encounter for immunization: Secondary | ICD-10-CM

## 2019-01-01 NOTE — Progress Notes (Signed)
Patient came in today to receive her annual flu vaccine. Fluarix was given in the right deltoid. Patient tolerated well. VIS given.

## 2019-08-01 ENCOUNTER — Ambulatory Visit: Payer: 59 | Attending: Internal Medicine

## 2019-08-01 DIAGNOSIS — Z23 Encounter for immunization: Secondary | ICD-10-CM

## 2019-08-01 NOTE — Progress Notes (Signed)
   Covid-19 Vaccination Clinic  Name:  Jo Arnold    MRN: [id number] DOB: November 08, 1985  08/01/2019  Ms. Jo Arnold was observed post Covid-19 immunization for 15 minutes without incident. She was provided with Vaccine Information Sheet and instruction to access the V-Safe system.   Ms. Jo Arnold was instructed to call 911 with any severe reactions post vaccine: Marland Kitchen Difficulty breathing  . Swelling of face and throat  . A fast heartbeat  . A bad rash all over body  . Dizziness and weakness   Immunizations Administered    Name Date Dose VIS Date Route   Moderna COVID-19 Vaccine 08/01/2019  8:19 AM 0.5 mL 03/2019 Intramuscular   Manufacturer: Moderna   Lot: [id number]   NDC: 74935-521-74

## 2019-08-29 ENCOUNTER — Ambulatory Visit: Payer: 59 | Attending: Internal Medicine

## 2019-08-29 DIAGNOSIS — Z23 Encounter for immunization: Secondary | ICD-10-CM

## 2019-08-29 NOTE — Progress Notes (Signed)
   Covid-19 Vaccination Clinic  Name:  Jo Arnold    MRN: [id number] DOB: Dec 19, 1985  08/29/2019  Ms. [name] was observed post Covid-19 immunization for 15 minutes without incident. She was provided with Vaccine Information Sheet and instruction to access the V-Safe system.   Ms. [name] was instructed to call 911 with any severe reactions post vaccine: Marland Kitchen Difficulty breathing  . Swelling of face and throat  . A fast heartbeat  . A bad rash all over body  . Dizziness and weakness   Immunizations Administered    Name Date Dose VIS Date Route   Moderna COVID-19 Vaccine 08/29/2019  9:30 AM 0.5 mL 03/2019 Intramuscular   Manufacturer: Moderna   Lot: [id number]   NDC: 96728-979-15

## 2020-05-21 ENCOUNTER — Ambulatory Visit: Payer: 59 | Admitting: Family Medicine

## 2020-05-21 ENCOUNTER — Encounter: Payer: Self-pay | Admitting: Family Medicine

## 2020-05-21 ENCOUNTER — Other Ambulatory Visit: Payer: Self-pay

## 2020-05-21 VITALS — BP 102/60 | HR 78 | Temp 98.6°F | Resp 14 | Ht 61.5 in | Wt 114.0 lb

## 2020-05-21 DIAGNOSIS — Z0000 Encounter for general adult medical examination without abnormal findings: Secondary | ICD-10-CM | POA: Diagnosis not present

## 2020-05-21 MED ORDER — PANTOPRAZOLE SODIUM 40 MG PO TBEC: 40.0000 mg | DELAYED_RELEASE_TABLET | Freq: Every day | ORAL | 0 refills | Status: DC

## 2020-05-21 NOTE — Progress Notes (Signed)
Subjective:    Patient ID: Jo Arnold, female    DOB: 1985-11-04, 35 y.o.   MRN: [id number]  HPI  Patient is a very pleasant 35 year old Hispanic female here today for complete physical exam.  She sees a gynecologist who performs her Pap smears and these are up-to-date.  She has no family history of breast cancer and therefore does not require mammogram until age 69.  She has 2 children.  One is age 23.  The other is age 22.  Regarding her shots, she is due for a flu shot.  She is also due for a booster on the Covid vaccine which I strongly encouraged.  She is been having some vague abdominal discomfort.  She states that in the epigastric area usually first thing in the mornings she will have dyspepsia.  She will report some mild vague nausea and some discomfort.  Is not a sharp pain is more of a irritation.  It tends to be worse in the morning after eating fatty meals such as chips or pizza or hamburgers.  She denies any pain in the right upper quadrant.  She denies any melena or hematochezia.  She denies any vomiting.  She denies any constipation or diarrhea.  She has not tried any medication yet for the situation.  Past Medical History:  Diagnosis Date  . Abnormal Pap smear   . Anemia 12/2010  . Hx of pyelonephritis   . Hx of varicella   . Medical history non-contributory   . No pertinent past medical history   . Vaginal Pap smear, abnormal    Past Surgical History:  Procedure Laterality Date  . CESAREAN SECTION N/A 03/09/2013   Procedure: CESAREAN SECTION;  Surgeon: Jeani Hawking, MD;  Location: [facility];  Service: Obstetrics;  Laterality: N/A;  . CESAREAN SECTION N/A    Phreesia 05/18/2020  . NO PAST SURGERIES     No current outpatient medications on file prior to visit.   No current facility-administered medications on file prior to visit.   No Known Allergies Social History   Socioeconomic History  . Marital status: Married    Spouse name: Not on file  . Number of  children: Not on file  . Years of education: Not on file  . Highest education level: Not on file  Occupational History  . Not on file  Tobacco Use  . Smoking status: Never Smoker  . Smokeless tobacco: Never Used  Substance and Sexual Activity  . Alcohol use: No    Comment: before pregnancy  . Drug use: No  . Sexual activity: Yes    Birth control/protection: None, Condom  Other Topics Concern  . Not on file  Social History Narrative   Married.    1 child--son-- as of 09/2013- he is 2 months old   She stays home with baby   She lived in [place] until age 80 y/o   Then lived in [place] to [place] 2014   Social Determinants of Health   Financial Resource Strain: Not on [employer] Insecurity: Not on file  Transportation Needs: Not on file  Physical Activity: Not on file  Stress: Not on file  Social Connections: Not on file  Intimate Partner Violence: Not on file   Family History  Problem Relation Age of Onset  . Hypertension Mother   . Hyperlipidemia Mother   . Heart disease Father        bypass age 46  .  Stroke Father   . Heart disease Brother        bypass age 36  . Alcohol abuse Brother   . Stroke Brother      Review of Systems  All other systems reviewed and are negative.      Objective:   Physical Exam Vitals reviewed.  Constitutional:      General: She is not in acute distress.    Appearance: Normal appearance. She is normal weight. She is not ill-appearing, toxic-appearing or diaphoretic.  HENT:     Head: Normocephalic and atraumatic.     Right Ear: Tympanic membrane, ear canal and external ear normal. There is no impacted cerumen.     Left Ear: Tympanic membrane, ear canal and external ear normal. There is no impacted cerumen.     Nose: Nose normal. No congestion or rhinorrhea.     Mouth/Throat:     Mouth: Mucous membranes are moist.     Pharynx: Oropharynx is clear. No oropharyngeal exudate or posterior oropharyngeal erythema.  Eyes:      General: No scleral icterus.       Right eye: No discharge.        Left eye: No discharge.     Extraocular Movements: Extraocular movements intact.     Conjunctiva/sclera: Conjunctivae normal.     Pupils: Pupils are equal, round, and reactive to light.  Neck:     Vascular: No carotid bruit.  Cardiovascular:     Rate and Rhythm: Normal rate and regular rhythm.     Pulses: Normal pulses.     Heart sounds: Normal heart sounds. No murmur heard. No friction rub. No gallop.   Pulmonary:     Effort: Pulmonary effort is normal. No respiratory distress.     Breath sounds: Normal breath sounds. No stridor. No wheezing, rhonchi or rales.  Chest:     Chest wall: No tenderness.  Abdominal:     General: Abdomen is flat. Bowel sounds are normal. There is no distension.     Palpations: Abdomen is soft. There is no mass.     Tenderness: There is no abdominal tenderness. There is no right CVA tenderness, left CVA tenderness, guarding or rebound.     Hernia: No hernia is present.  Musculoskeletal:     Cervical back: Normal range of motion and neck supple. No rigidity.     Right lower leg: No edema.     Left lower leg: No edema.  Lymphadenopathy:     Cervical: No cervical adenopathy.  Skin:    General: Skin is warm.     Coloration: Skin is not jaundiced or pale.     Findings: No bruising, erythema, lesion or rash.  Neurological:     General: No focal deficit present.     Mental Status: She is alert and oriented to person, place, and time. Mental status is at baseline.     Cranial Nerves: No cranial nerve deficit.     Sensory: No sensory deficit.     Motor: No weakness.     Coordination: Coordination normal.     Gait: Gait normal.     Deep Tendon Reflexes: Reflexes normal.  Psychiatric:        Mood and Affect: Mood normal.        Behavior: Behavior normal.        Thought Content: Thought content normal.        Judgment: Judgment normal.           Assessment &  Plan:  General  medical exam - Plan: Lipid panel, COMPLETE METABOLIC PANEL WITH GFR, CBC with Differential/Platelet I believe the patient is having dyspepsia perhaps due to gastritis.  We will try Protonix 40 mg a day for 2 weeks.  If the situation is improving I recommended that she take it every day for 4 weeks total to allow the situation to heal and then stop the medication.  Her Pap smear is up-to-date.  I encouraged her to get the Covid vaccine.  I will check a CBC, CMP, fasting lipid panel.  I will defer her Pap smear to her gynecologist which is up-to-date. Physical exam is completely normal.  Neurologic exam is completely normal.  There is no papilledema on funduscopic examination.  There is no neurologic deficit on exam.  I believe the patient is dealing with chronic daily tension headache.  I would like her to try Flexeril 1/2 to 1 tablet at night as needed headache.  If the muscle relaxer helps the headaches, I would focus on management of muscle tension headaches to include yoga, meditation, and daily exercise in the afternoon as a physical outlet for stress relief to help prevent these headaches in the future.  If the headaches worsen, I would proceed with cranial imaging to rule out increased intracranial pressure however I do believe these are more likely tension headaches and less likely migraines.  If necessary consider preventative medication such as gabapentin or Topamax.  Check CBC, CMP, fasting lipid panel.  Flu shot and tetanus shot are up-to-date

## 2020-05-22 LAB — COMPLETE METABOLIC PANEL WITH GFR
AG Ratio: 1.9 (calc) (ref 1.0–2.5)
ALT: 15 U/L (ref 6–29)
AST: 19 U/L (ref 10–30)
Albumin: 4.6 g/dL (ref 3.6–5.1)
Alkaline phosphatase (APISO): 63 U/L (ref 31–125)
BUN: 9 mg/dL (ref 7–25)
CO2: 27 mmol/L (ref 20–32)
Calcium: 10 mg/dL (ref 8.6–10.2)
Chloride: 105 mmol/L (ref 98–110)
Creat: 0.78 mg/dL (ref 0.50–1.10)
GFR, Est African American: 115 mL/min/{1.73_m2} (ref >=60)
GFR, Est Non African American: 99 mL/min/{1.73_m2} (ref >=60)
Globulin: 2.4 g/dL (calc) (ref 1.9–3.7)
Glucose, Bld: 81 mg/dL (ref 65–99)
Potassium: 5.1 mmol/L (ref 3.5–5.3)
Sodium: 140 mmol/L (ref 135–146)
Total Bilirubin: 0.6 mg/dL (ref 0.2–1.2)
Total Protein: 7 g/dL (ref 6.1–8.1)

## 2020-05-22 LAB — LIPID PANEL
Cholesterol: 151 mg/dL (ref <200)
HDL: 56 mg/dL (ref >=50)
LDL Cholesterol (Calc): 81 mg/dL (calc)
Non-HDL Cholesterol (Calc): 95 mg/dL (calc) (ref <130)
Total CHOL/HDL Ratio: 2.7 (calc) (ref <5.0)
Triglycerides: 48 mg/dL (ref <150)

## 2020-05-22 LAB — CBC WITH DIFFERENTIAL/PLATELET
Absolute Monocytes: 421 cells/uL (ref 200–950)
Basophils Absolute: 51 cells/uL (ref 0–200)
Basophils Relative: 1.3 %
Eosinophils Absolute: 90 cells/uL (ref 15–500)
Eosinophils Relative: 2.3 %
HCT: 44.4 % (ref 35.0–45.0)
Hemoglobin: 15.1 g/dL (ref 11.7–15.5)
Lymphs Abs: 1045 cells/uL (ref 850–3900)
MCH: 31.5 pg (ref 27.0–33.0)
MCHC: 34 g/dL (ref 32.0–36.0)
MCV: 92.5 fL (ref 80.0–100.0)
MPV: 11.3 fL (ref 7.5–12.5)
Monocytes Relative: 10.8 %
Neutro Abs: 2293 cells/uL (ref 1500–7800)
Neutrophils Relative %: 58.8 %
Platelets: 206 10*3/uL (ref 140–400)
RBC: 4.8 10*6/uL (ref 3.80–5.10)
RDW: 11.7 % (ref 11.0–15.0)
Total Lymphocyte: 26.8 %
WBC: 3.9 10*3/uL (ref 3.8–10.8)

## 2020-05-26 LAB — HM PAP SMEAR: HM Pap smear: NEGATIVE

## 2020-05-27 NOTE — Progress Notes (Signed)
Sent lab letter via [employer]

## 2020-06-05 ENCOUNTER — Ambulatory Visit (INDEPENDENT_AMBULATORY_CARE_PROVIDER_SITE_OTHER): Payer: 59

## 2020-06-05 ENCOUNTER — Other Ambulatory Visit: Payer: Self-pay

## 2020-06-05 ENCOUNTER — Encounter: Payer: Self-pay | Admitting: Emergency Medicine

## 2020-06-05 ENCOUNTER — Ambulatory Visit
Admission: EM | Admit: 2020-06-05 | Discharge: 2020-06-05 | Disposition: A | Discharge status: Home / Self Care | Payer: 59 | Attending: Emergency Medicine | Admitting: Emergency Medicine

## 2020-06-05 DIAGNOSIS — W19XXXA Unspecified fall, initial encounter: Secondary | ICD-10-CM

## 2020-06-05 DIAGNOSIS — M79642 Pain in left hand: Secondary | ICD-10-CM | POA: Diagnosis not present

## 2020-06-05 DIAGNOSIS — M79645 Pain in left finger(s): Secondary | ICD-10-CM

## 2020-06-05 MED ORDER — MELOXICAM 7.5 MG PO TABS: 7.5000 mg | ORAL_TABLET | Freq: Every day | ORAL | 0 refills | Status: DC

## 2020-06-05 NOTE — ED Provider Notes (Signed)
[facility]   [id number] 06/05/20 Arrival Time: 0850  CC: LT hand injury  SUBJECTIVE: History from: patient. Jo Arnold is a 35 y.o. female complains of LT hand pain x 1 month.  Had fall on hand 1 month ago   Localizes the pain to the hand.  Describes the pain as intermittent and achy in character.  Has tried OTC medications without relief.  Symptoms are made worse with ROM about the middle and ring finger, or making a fist.  Denies similar symptoms in the past.  Reports initially having bruising, now resolved.  Denies fever, chills, erythema, weakness, numbness and tingling.  ROS: As per HPI.  All other pertinent ROS negative.     Past Medical History:  Diagnosis Date  . Abnormal Pap smear   . Anemia 12/2010  . Hx of pyelonephritis   . Hx of varicella   . Medical history non-contributory   . No pertinent past medical history   . Vaginal Pap smear, abnormal    Past Surgical History:  Procedure Laterality Date  . CESAREAN SECTION N/A 03/09/2013   Procedure: CESAREAN SECTION;  Surgeon: Jeani Hawking, MD;  Location: [facility];  Service: Obstetrics;  Laterality: N/A;  . CESAREAN SECTION N/A    Phreesia 05/18/2020  . NO PAST SURGERIES     No Known Allergies No current facility-administered medications on file prior to encounter.   Current Outpatient Medications on File Prior to Encounter  Medication Sig Dispense Refill  . pantoprazole (PROTONIX) 40 MG tablet Take 1 tablet (40 mg total) by mouth daily. 30 tablet 0   Social History   Socioeconomic History  . Marital status: Married    Spouse name: Not on file  . Number of children: Not on file  . Years of education: Not on file  . Highest education level: Not on file  Occupational History  . Not on file  Tobacco Use  . Smoking status: Never Smoker  . Smokeless tobacco: Never Used  Substance and Sexual Activity  . Alcohol use: No    Comment: before pregnancy  . Drug use: No  . Sexual activity: Yes     Birth control/protection: None, Condom  Other Topics Concern  . Not on file  Social History Narrative   Married.    1 child--son-- as of 09/2013- he is 36 months old   She stays home with baby   She lived in [place] until age 59 y/o   Then lived in [place] to [place] 2014   Social Determinants of Health   Financial Resource Strain: Not on [employer] Insecurity: Not on file  Transportation Needs: Not on file  Physical Activity: Not on file  Stress: Not on file  Social Connections: Not on file  Intimate Partner Violence: Not on file   Family History  Problem Relation Age of Onset  . Hypertension Mother   . Hyperlipidemia Mother   . Heart disease Father        bypass age 70  . Stroke Father   . Heart disease Brother        bypass age 55  . Alcohol abuse Brother   . Stroke Brother     OBJECTIVE:  Vitals:   06/05/20 0903  BP: 104/68  Pulse: 68  Resp: 17  Temp: 98 F (36.7 C)  TempSrc: Oral  SpO2: 97%    General appearance: ALERT; in no acute distress.  Head: NCAT Lungs: Normal respiratory effort  CV: Radial pulse 2+  Musculoskeletal: LT hand Inspection: Skin warm, dry, clear and intact without obvious erythema, effusion, or ecchymosis.  Palpation: mild TTP over distal 3rd and 4th MCs ROM: LROM Strength: unable to make a fist Skin: warm and dry Neurologic: Ambulates without difficulty; Sensation intact about the upper/ lower extremities Psychological: alert and cooperative; normal mood and affect  DIAGNOSTIC STUDIES:  DG Hand Complete Left  Result Date: 06/05/2020 CLINICAL DATA:  Fall 1 month ago with third digit pain, initial encounter EXAM: LEFT HAND - COMPLETE 3+ VIEW COMPARISON:  None. FINDINGS: There is no evidence of fracture or dislocation. There is no evidence of arthropathy or other focal bone abnormality. Soft tissues are unremarkable. IMPRESSION: No acute abnormality noted. Electronically Signed   By: Alcide Clever M.D.   On: 06/05/2020 09:49     X-rays negative for bony abnormalities including fracture, or dislocation.  No soft tissue swelling.    I have reviewed the x-rays myself and the radiologist interpretation. I am in agreement with the radiologist interpretation.     ASSESSMENT & PLAN:  1. Hand pain, left     Meds ordered this encounter  Medications  . meloxicam (MOBIC) 7.5 MG tablet    Sig: Take 1 tablet (7.5 mg total) by mouth daily.    Dispense:  30 tablet    Refill:  0    Order Specific Question:   Supervising Provider    Answer:   Eustace Moore [[id number]]   X-ray negative for fracture or dislocation Continue conservative management of rest, ice, and elevation Take mobic as needed for pain relief (may cause abdominal discomfort, ulcers, and GI bleeds avoid taking with other NSAIDs) Splint applied Follow up with PCP if symptoms persist Return or go to the ER if you have any new or worsening symptoms (fever, chills, chest pain, redness, swelling, bruising etc...)    Reviewed expectations re: course of current medical issues. Questions answered. Outlined signs and symptoms indicating need for more acute intervention. Patient verbalized understanding. After Visit Summary given.    Rennis Harding, PA-C 06/05/20 [phone]

## 2020-06-05 NOTE — ED Triage Notes (Signed)
Pt fell about a month ago and hurt LT middle finger.  Has had pain to hand directly under finger.

## 2020-06-05 NOTE — Discharge Instructions (Signed)
X-ray negative for fracture or dislocation Continue conservative management of rest, ice, and elevation Take mobic as needed for pain relief (may cause abdominal discomfort, ulcers, and GI bleeds avoid taking with other NSAIDs) Splint applied Follow up with PCP if symptoms persist Return or go to the ER if you have any new or worsening symptoms (fever, chills, chest pain, redness, swelling, bruising etc...)

## 2021-03-22 ENCOUNTER — Other Ambulatory Visit: Payer: 59

## 2021-03-23 ENCOUNTER — Other Ambulatory Visit: Payer: 59

## 2021-03-23 ENCOUNTER — Ambulatory Visit: Payer: 59 | Admitting: Family Medicine

## 2021-03-25 ENCOUNTER — Encounter: Payer: 59 | Admitting: Family Medicine

## 2021-05-24 ENCOUNTER — Other Ambulatory Visit: Payer: 59

## 2021-05-24 ENCOUNTER — Encounter: Payer: 59 | Admitting: Family Medicine

## 2021-05-25 ENCOUNTER — Other Ambulatory Visit: Payer: 59

## 2021-05-25 ENCOUNTER — Other Ambulatory Visit: Payer: Self-pay

## 2021-05-25 DIAGNOSIS — Z136 Encounter for screening for cardiovascular disorders: Secondary | ICD-10-CM

## 2021-05-25 DIAGNOSIS — Z0000 Encounter for general adult medical examination without abnormal findings: Secondary | ICD-10-CM

## 2021-05-25 LAB — CBC WITH DIFFERENTIAL/PLATELET
Absolute Monocytes: 328 cells/uL (ref 200–950)
Basophils Absolute: 49 cells/uL (ref 0–200)
Basophils Relative: 1 %
Eosinophils Absolute: 162 cells/uL (ref 15–500)
Eosinophils Relative: 3.3 %
HCT: 45 % (ref 35.0–45.0)
Hemoglobin: 14.7 g/dL (ref 11.7–15.5)
Lymphs Abs: 1475 cells/uL (ref 850–3900)
MCH: 30 pg (ref 27.0–33.0)
MCHC: 32.7 g/dL (ref 32.0–36.0)
MCV: 91.8 fL (ref 80.0–100.0)
MPV: 11.3 fL (ref 7.5–12.5)
Monocytes Relative: 6.7 %
Neutro Abs: 2886 cells/uL (ref 1500–7800)
Neutrophils Relative %: 58.9 %
Platelets: 182 10*3/uL (ref 140–400)
RBC: 4.9 10*6/uL (ref 3.80–5.10)
RDW: 11.9 % (ref 11.0–15.0)
Total Lymphocyte: 30.1 %
WBC: 4.9 10*3/uL (ref 3.8–10.8)

## 2021-05-25 LAB — COMPREHENSIVE METABOLIC PANEL
AG Ratio: 2 (calc) (ref 1.0–2.5)
ALT: 16 U/L (ref 6–29)
AST: 17 U/L (ref 10–30)
Albumin: 4.8 g/dL (ref 3.6–5.1)
Alkaline phosphatase (APISO): 65 U/L (ref 31–125)
BUN: 12 mg/dL (ref 7–25)
CO2: 28 mmol/L (ref 20–32)
Calcium: 9.9 mg/dL (ref 8.6–10.2)
Chloride: 106 mmol/L (ref 98–110)
Creat: 0.86 mg/dL (ref 0.50–0.97)
Globulin: 2.4 g/dL (calc) (ref 1.9–3.7)
Glucose, Bld: 83 mg/dL (ref 65–99)
Potassium: 4.8 mmol/L (ref 3.5–5.3)
Sodium: 141 mmol/L (ref 135–146)
Total Bilirubin: 0.6 mg/dL (ref 0.2–1.2)
Total Protein: 7.2 g/dL (ref 6.1–8.1)

## 2021-05-25 LAB — LIPID PANEL
Cholesterol: 176 mg/dL (ref <200)
HDL: 64 mg/dL (ref >=50)
LDL Cholesterol (Calc): 98 mg/dL (calc)
Non-HDL Cholesterol (Calc): 112 mg/dL (calc) (ref <130)
Total CHOL/HDL Ratio: 2.8 (calc) (ref <5.0)
Triglycerides: 46 mg/dL (ref <150)

## 2021-05-27 ENCOUNTER — Ambulatory Visit (INDEPENDENT_AMBULATORY_CARE_PROVIDER_SITE_OTHER): Payer: 59 | Admitting: Family Medicine

## 2021-05-27 ENCOUNTER — Encounter: Payer: Self-pay | Admitting: Family Medicine

## 2021-05-27 ENCOUNTER — Other Ambulatory Visit: Payer: Self-pay

## 2021-05-27 VITALS — BP 98/68 | HR 82 | Temp 97.2°F | Resp 18 | Ht 61.5 in | Wt 111.0 lb

## 2021-05-27 DIAGNOSIS — Z0000 Encounter for general adult medical examination without abnormal findings: Secondary | ICD-10-CM

## 2021-05-27 NOTE — Progress Notes (Signed)
Subjective:    Patient ID: Jo Arnold, female    DOB: 06/11/1985, 36 y.o.   MRN: [id number]  HPI  Patient is a very pleasant 36 year old female here today for complete physical exam.  She sees a gynecologist for her Pap smears.  She does have a history of a mammogram a few years ago but this was due to a lump that she felt that turned out to be a benign cyst.  She denies any family history of breast cancer or colon cancer.  She is not due for mammogram then again until age 36.  She is not due for colon cancer screening.  She is due for a flu shot which she politely declines today.  We discussed the risk and benefits of COVID and she elects against COVID vaccination.  Her most recent lab work is listed below: Lab on 05/25/2021  Component Date Value Ref Range Status   Cholesterol 05/25/2021 176  <200 mg/dL Final   HDL 57/84/696202/21/2023 64  > OR = 50 mg/dL Final   Triglycerides 95/28/413202/21/2023 46  <150 mg/dL Final   LDL Cholesterol (Calc) 05/25/2021 98  mg/dL (calc) Final   Comment: Reference range: <100 . Desirable range <100 mg/dL for primary prevention;   <70 mg/dL for patients with CHD or diabetic patients  with > or = 2 CHD risk factors. Marland Kitchen. LDL-C is now calculated using the Martin-Hopkins  calculation, which is a validated novel method providing  better accuracy than the Friedewald equation in the  estimation of LDL-C.  Horald PollenMartin SS et al. Lenox AhrJAMA. 4401;027(252013;310(19): 2061-2068  (http://education.[facility].com/faq/FAQ164)    Total CHOL/HDL Ratio 05/25/2021 2.8  <3.6<5.0 (calc) Final   Non-HDL Cholesterol (Calc) 05/25/2021 112  <130 mg/dL (calc) Final   Comment: For patients with diabetes plus 1 major ASCVD risk  factor, treating to a non-HDL-C goal of <100 mg/dL  (LDL-C of <64<70 mg/dL) is considered a therapeutic  option.    Glucose, Bld 05/25/2021 83  65 - 99 mg/dL Final   Comment: .            Fasting reference interval .    BUN 05/25/2021 12  7 - 25 mg/dL Final   Creat 40/34/742502/21/2023 0.86   0.50 - 0.97 mg/dL Final   BUN/Creatinine Ratio 05/25/2021 NOT APPLICABLE  6 - 22 (calc) Final   Sodium 05/25/2021 141  135 - 146 mmol/L Final   Potassium 05/25/2021 4.8  3.5 - 5.3 mmol/L Final   Chloride 05/25/2021 106  98 - 110 mmol/L Final   CO2 05/25/2021 28  20 - 32 mmol/L Final   Calcium 05/25/2021 9.9  8.6 - 10.2 mg/dL Final   Total Protein 95/63/875602/21/2023 7.2  6.1 - 8.1 g/dL Final   Albumin 43/32/951802/21/2023 4.8  3.6 - 5.1 g/dL Final   Globulin 84/16/606302/21/2023 2.4  1.9 - 3.7 g/dL (calc) Final   AG Ratio 05/25/2021 2.0  1.0 - 2.5 (calc) Final   Total Bilirubin 05/25/2021 0.6  0.2 - 1.2 mg/dL Final   Alkaline phosphatase (APISO) 05/25/2021 65  31 - 125 U/L Final   AST 05/25/2021 17  10 - 30 U/L Final   ALT 05/25/2021 16  6 - 29 U/L Final   WBC 05/25/2021 4.9  3.8 - 10.8 Thousand/uL Final   RBC 05/25/2021 4.90  3.80 - 5.10 Million/uL Final   Hemoglobin 05/25/2021 14.7  11.7 - 15.5 g/dL Final   HCT 01/60/109302/21/2023 45.0  35.0 - 45.0 % Final   MCV 05/25/2021 91.8  80.0 -  100.0 fL Final   MCH 05/25/2021 30.0  27.0 - 33.0 pg Final   MCHC 05/25/2021 32.7  32.0 - 36.0 g/dL Final   RDW 00/76/2263 11.9  11.0 - 15.0 % Final   Platelets 05/25/2021 182  140 - 400 Thousand/uL Final   MPV 05/25/2021 11.3  7.5 - 12.5 fL Final   Neutro Abs 05/25/2021 2,886  1,500 - 7,800 cells/uL Final   Lymphs Abs 05/25/2021 1,475  850 - 3,900 cells/uL Final   Absolute Monocytes 05/25/2021 328  200 - 950 cells/uL Final   Eosinophils Absolute 05/25/2021 162  15 - 500 cells/uL Final   Basophils Absolute 05/25/2021 49  0 - 200 cells/uL Final   Neutrophils Relative % 05/25/2021 58.9  % Final   Total Lymphocyte 05/25/2021 30.1  % Final   Monocytes Relative 05/25/2021 6.7  % Final   Eosinophils Relative 05/25/2021 3.3  % Final   Basophils Relative 05/25/2021 1.0  % Final    Past Medical History:  Diagnosis Date   Abnormal Pap smear    Anemia 12/2010   Hx of pyelonephritis    Hx of varicella    Medical history non-contributory     No pertinent past medical history    Vaginal Pap smear, abnormal    Past Surgical History:  Procedure Laterality Date   CESAREAN SECTION N/A 03/09/2013   Procedure: CESAREAN SECTION;  Surgeon: Jeani Hawking, MD;  Location: [facility];  Service: Obstetrics;  Laterality: N/A;   CESAREAN SECTION N/A    Phreesia 05/18/2020   NO PAST SURGERIES     No current outpatient medications on file prior to visit.   No current facility-administered medications on file prior to visit.   No Known Allergies Social History   Socioeconomic History   Marital status: Married    Spouse name: Not on file   Number of children: Not on file   Years of education: Not on file   Highest education level: Not on file  Occupational History   Not on file  Tobacco Use   Smoking status: Never   Smokeless tobacco: Never  Substance and Sexual Activity   Alcohol use: No    Comment: before pregnancy   Drug use: No   Sexual activity: Yes    Birth control/protection: None, Condom  Other Topics Concern   Not on file  Social History Narrative   Married.    1 child--son-- as of 09/2013- he is 33 months old   She stays home with baby   She lived in [place] until age 70 y/o   Then lived in [place] to [place] 2014   Social Determinants of Health   Financial Resource Strain: Not on [employer] Insecurity: Not on file  Transportation Needs: Not on file  Physical Activity: Not on file  Stress: Not on file  Social Connections: Not on file  Intimate Partner Violence: Not on file   Family History  Problem Relation Age of Onset   Hypertension Mother    Hyperlipidemia Mother    Heart disease Father        bypass age 35   Stroke Father    Heart disease Brother        bypass age 16   Alcohol abuse Brother    Stroke Brother      Review of Systems  All other systems reviewed and are negative.     Objective:   Physical Exam Vitals reviewed.  Constitutional:  General: She is not in acute  distress.    Appearance: Normal appearance. She is normal weight. She is not ill-appearing, toxic-appearing or diaphoretic.  HENT:     Head: Normocephalic and atraumatic.     Right Ear: Tympanic membrane, ear canal and external ear normal. There is no impacted cerumen.     Left Ear: Tympanic membrane, ear canal and external ear normal. There is no impacted cerumen.     Nose: Nose normal. No congestion or rhinorrhea.     Mouth/Throat:     Mouth: Mucous membranes are moist.     Pharynx: Oropharynx is clear. No oropharyngeal exudate or posterior oropharyngeal erythema.  Eyes:     General: No scleral icterus.       Right eye: No discharge.        Left eye: No discharge.     Extraocular Movements: Extraocular movements intact.     Conjunctiva/sclera: Conjunctivae normal.     Pupils: Pupils are equal, round, and reactive to light.  Neck:     Vascular: No carotid bruit.  Cardiovascular:     Rate and Rhythm: Normal rate and regular rhythm.     Pulses: Normal pulses.     Heart sounds: Normal heart sounds. No murmur heard.   No friction rub. No gallop.  Pulmonary:     Effort: Pulmonary effort is normal. No respiratory distress.     Breath sounds: Normal breath sounds. No stridor. No wheezing, rhonchi or rales.  Chest:     Chest wall: No tenderness.  Abdominal:     General: Abdomen is flat. Bowel sounds are normal. There is no distension.     Palpations: Abdomen is soft. There is no mass.     Tenderness: There is no abdominal tenderness. There is no right CVA tenderness, left CVA tenderness, guarding or rebound.     Hernia: No hernia is present.  Musculoskeletal:     Cervical back: Normal range of motion and neck supple. No rigidity.     Right lower leg: No edema.     Left lower leg: No edema.  Lymphadenopathy:     Cervical: No cervical adenopathy.  Skin:    General: Skin is warm.     Coloration: Skin is not jaundiced or pale.     Findings: No bruising, erythema, lesion or rash.   Neurological:     General: No focal deficit present.     Mental Status: She is alert and oriented to person, place, and time. Mental status is at baseline.     Cranial Nerves: No cranial nerve deficit.     Sensory: No sensory deficit.     Motor: No weakness.     Coordination: Coordination normal.     Gait: Gait normal.     Deep Tendon Reflexes: Reflexes normal.  Psychiatric:        Mood and Affect: Mood normal.        Behavior: Behavior normal.        Thought Content: Thought content normal.        Judgment: Judgment normal.          Assessment & Plan:  General medical exam Patient has been under more stress recently.  She is occasionally having insomnia.  We discussed options and I think it would be fine for her to take 25 to 50 mg of Benadryl sparingly as needed for insomnia.  There is a possibility that she wants to conceive again in the future as well recommended a  folic acid supplement.  Also recommended a women's One-A-Day vitamin to make sure she is getting adequate calcium and vitamin D due to her BMI of 20 help prevent osteoporosis in the future.  Lab work is outstanding.  Regular anticipatory guidance

## 2021-09-08 ENCOUNTER — Other Ambulatory Visit: Payer: Self-pay | Admitting: Nurse Practitioner

## 2021-09-08 DIAGNOSIS — N6320 Unspecified lump in the left breast, unspecified quadrant: Secondary | ICD-10-CM

## 2021-09-09 ENCOUNTER — Ambulatory Visit
Admission: RE | Admit: 2021-09-09 | Discharge: 2021-09-09 | Disposition: A | Discharge status: Home / Self Care | Payer: 59 | Source: Ambulatory Visit | Attending: Nurse Practitioner | Admitting: Nurse Practitioner

## 2021-09-09 DIAGNOSIS — N6320 Unspecified lump in the left breast, unspecified quadrant: Secondary | ICD-10-CM

## 2022-04-05 ENCOUNTER — Ambulatory Visit
Admission: EM | Admit: 2022-04-05 | Discharge: 2022-04-05 | Disposition: A | Discharge status: Home / Self Care | Payer: 59 | Attending: Urgent Care | Admitting: Urgent Care

## 2022-04-05 ENCOUNTER — Encounter: Payer: Self-pay | Admitting: Emergency Medicine

## 2022-04-05 DIAGNOSIS — R21 Rash and other nonspecific skin eruption: Secondary | ICD-10-CM

## 2022-04-05 DIAGNOSIS — B349 Viral infection, unspecified: Secondary | ICD-10-CM

## 2022-04-05 MED ORDER — PREDNISONE 10 MG PO TABS: 30.0000 mg | ORAL_TABLET | Freq: Every day | ORAL | 0 refills | Status: AC

## 2022-04-05 MED ORDER — CETIRIZINE HCL 10 MG PO TABS: 10.0000 mg | ORAL_TABLET | Freq: Every day | ORAL | 0 refills | Status: AC

## 2022-04-05 MED ORDER — PROMETHAZINE-DM 6.25-15 MG/5ML PO SYRP: 2.5000 mL | ORAL_SOLUTION | Freq: Three times a day (TID) | ORAL | 0 refills | Status: AC | PRN

## 2022-04-05 MED ORDER — VALACYCLOVIR HCL 1 G PO TABS: 1000.0000 mg | ORAL_TABLET | Freq: Three times a day (TID) | ORAL | 0 refills | Status: AC

## 2022-04-05 NOTE — ED Triage Notes (Signed)
Fever, chills, sore throat, cough, pain behind ears on both sides.  Rash on right side of body that started today..  Symptoms since 12/28.  Took thera flu last night

## 2022-04-05 NOTE — ED Provider Notes (Signed)
[facility]  Note:  This document was prepared using Dragon voice recognition software and may include unintentional dictation errors.  MRN: [id number] DOB: Jan 04, 1986  Subjective:   Jo Arnold is a 37 y.o. female presenting for 4 to 5-day history of acute onset persistent congestion, malaise, fatigue, coughing, sinus drainage.  Patient started to develop a rash over the right side of her torso.  Reports that is fairly itchy but not painful.  No vesicular lesions.  No chest pain, shortness of breath or wheezing.  She is primarily concerned about her rash.  She did have exposure to multiple sick contacts at home with her family members but they are all dramatically improved.  No current facility-administered medications for this encounter. No current outpatient medications on file.   No Known Allergies  Past Medical History:  Diagnosis Date   Abnormal Pap smear    Anemia 12/2010   Hx of pyelonephritis    Hx of varicella    Medical history non-contributory    No pertinent past medical history    Vaginal Pap smear, abnormal      Past Surgical History:  Procedure Laterality Date   CESAREAN SECTION N/A 03/09/2013   Procedure: CESAREAN SECTION;  Surgeon: Cyril Mourning, MD;  Location: [facility];  Service: Obstetrics;  Laterality: N/A;   CESAREAN SECTION N/A    Phreesia 05/18/2020   NO PAST SURGERIES      Family History  Problem Relation Age of Onset   Hypertension Mother    Hyperlipidemia Mother    Heart disease Father        bypass age 46   Stroke Father    Heart disease Brother        bypass age 20   Alcohol abuse Brother    Stroke Brother     Social History   Tobacco Use   Smoking status: Never   Smokeless tobacco: Never  Substance Use Topics   Alcohol use: No    Comment: before pregnancy   Drug use: No    ROS   Objective:   Vitals: BP 97/65 (BP Location: Right Arm)   Pulse 82   Temp 98.6 F (37 C) (Oral)   Resp 18   SpO2 98%    Physical Exam Constitutional:      General: She is not in acute distress.    Appearance: Normal appearance. She is well-developed and normal weight. She is not ill-appearing, toxic-appearing or diaphoretic.  HENT:     Head: Normocephalic and atraumatic.     Right Ear: Tympanic membrane, ear canal and external ear normal. No drainage or tenderness. No middle ear effusion. There is no impacted cerumen. Tympanic membrane is not erythematous or bulging.     Left Ear: Tympanic membrane, ear canal and external ear normal. No drainage or tenderness.  No middle ear effusion. There is no impacted cerumen. Tympanic membrane is not erythematous or bulging.     Nose: Nose normal. No congestion or rhinorrhea.     Mouth/Throat:     Mouth: Mucous membranes are moist. No oral lesions.     Pharynx: No pharyngeal swelling, oropharyngeal exudate, posterior oropharyngeal erythema or uvula swelling.     Tonsils: No tonsillar exudate or tonsillar abscesses.  Eyes:     General: No scleral icterus.       Right eye: No discharge.        Left eye: No discharge.     Extraocular Movements: Extraocular movements intact.  Right eye: Normal extraocular motion.     Left eye: Normal extraocular motion.     Conjunctiva/sclera: Conjunctivae normal.  Cardiovascular:     Rate and Rhythm: Normal rate and regular rhythm.     Heart sounds: Normal heart sounds. No murmur heard.    No friction rub. No gallop.  Pulmonary:     Effort: Pulmonary effort is normal. No respiratory distress.     Breath sounds: No stridor. No wheezing, rhonchi or rales.  Chest:     Chest wall: No tenderness.  Musculoskeletal:     Cervical back: Normal range of motion and neck supple.  Lymphadenopathy:     Cervical: No cervical adenopathy.  Skin:    General: Skin is warm and dry.     Findings: Rash (macular patches over the right torso extending from the flank side to the right thoracic side) present.  Neurological:     General: No focal  deficit present.     Mental Status: She is alert and oriented to person, place, and time.  Psychiatric:        Mood and Affect: Mood normal.        Behavior: Behavior normal.     Assessment and Plan :   PDMP not reviewed this encounter.  1. Acute viral syndrome   2. Rash and nonspecific skin eruption     Patient has been using over-the-counter medications for symptoms.  Discussed the possibility of a focal reaction versus shingles versus viral exanthem.  For now recommend an oral prednisone course together with supportive care for suspected primary viral illness.  Should patient develop any symptoms of shingles as discussed and demonstrated in clinic she is to start valacyclovir.  She declined COVID testing. Deferred imaging given clear cardiopulmonary exam, hemodynamically stable vital signs. Counseled patient on potential for adverse effects with medications prescribed/recommended today, ER and return-to-clinic precautions discussed, patient verbalized understanding.    Jaynee Eagles, [place] 04/05/22 1708

## 2022-04-05 NOTE — Discharge Instructions (Signed)
If your rash develops blisters and becomes more painful on the right torso, then that is a shingles rash. Start taking valacyclovir for this. Otherwise, we will manage this as a viral syndrome. For sore throat or cough try using a honey-based tea. Use 3 teaspoons of honey with juice squeezed from half lemon. Place shaved pieces of ginger into 1/2-1 cup of water and warm over stove top. Then mix the ingredients and repeat every 4 hours as needed. Please take Tylenol 500mg -650mg  once every 6 hours for fevers, aches and pains. Hydrate very well with at least 2 liters (64 ounces) of water. Eat light meals such as soups (chicken and noodles, chicken wild rice, vegetable).  Do not eat any foods that you are allergic to.  Start an antihistamine like Zyrtec for postnasal drainage, sinus congestion.  You can take this together with prednisone for 5 days. Use the cough medications as needed.

## 2022-04-07 ENCOUNTER — Ambulatory Visit: Payer: 59 | Admitting: Family Medicine

## 2022-05-30 ENCOUNTER — Ambulatory Visit (INDEPENDENT_AMBULATORY_CARE_PROVIDER_SITE_OTHER): Payer: 59 | Admitting: Family Medicine

## 2022-05-30 ENCOUNTER — Encounter: Payer: Self-pay | Admitting: Family Medicine

## 2022-05-30 VITALS — BP 110/56 | HR 77 | Temp 98.6°F | Ht 61.5 in | Wt 113.0 lb

## 2022-05-30 DIAGNOSIS — Z0000 Encounter for general adult medical examination without abnormal findings: Secondary | ICD-10-CM

## 2022-05-30 LAB — CBC WITH DIFFERENTIAL/PLATELET
Absolute Monocytes: 432 cells/uL (ref 200–950)
Eosinophils Relative: 2.5 %
WBC: 6 10*3/uL (ref 3.8–10.8)

## 2022-05-30 MED ORDER — ESCITALOPRAM OXALATE 10 MG PO TABS: 10.0000 mg | ORAL_TABLET | Freq: Every day | ORAL | 5 refills | Status: DC

## 2022-05-30 NOTE — Progress Notes (Signed)
Subjective:    Patient ID: Jo Arnold, female    DOB: 1985-08-01, 37 y.o.   MRN: [id number]  HPI  Patient is a very pleasant 37 year old female here today for complete physical exam.  She sees a gynecologist for her Pap smears.  Patient states that she is having trouble sleeping.  She finds herself waking up frequently during the night.  She states that her mind is constantly worrying and difficult to "shut off".  She states that she hesitates to use this to her but she feels like she may be dealing with depression.  She had to quit work to care for her children at home.  She thinks that she may be dealing with isolation and missing the interaction with other adults.  Furthermore she is under constant stress.  She reports feeling tired and lack of energy.  She is exercising.  She still drinks a lot of caffeine which may be contributing.  She avoids TV and cell phones and blue screens in the bedroom.  She has good sleep hygiene.  She denies any suicidal ideation.  Past Medical History:  Diagnosis Date   Abnormal Pap smear    Anemia 12/2010   Hx of pyelonephritis    Hx of varicella    Medical history non-contributory    No pertinent past medical history    Vaginal Pap smear, abnormal    Past Surgical History:  Procedure Laterality Date   CESAREAN SECTION N/A 03/09/2013   Procedure: CESAREAN SECTION;  Surgeon: Cyril Mourning, MD;  Location: [facility];  Service: Obstetrics;  Laterality: N/A;   CESAREAN SECTION N/A    Phreesia 05/18/2020   NO PAST SURGERIES     Current Outpatient Medications on File Prior to Visit  Medication Sig Dispense Refill   cetirizine (ZYRTEC ALLERGY) 10 MG tablet Take 1 tablet (10 mg total) by mouth daily. (Patient not taking: Reported on 05/30/2022) 30 tablet 0   predniSONE (DELTASONE) 10 MG tablet Take 3 tablets (30 mg total) by mouth daily with breakfast. (Patient not taking: Reported on 05/30/2022) 15 tablet 0   promethazine-dextromethorphan  (PROMETHAZINE-DM) 6.25-15 MG/5ML syrup Take 2.5 mLs by mouth 3 (three) times daily as needed for cough. (Patient not taking: Reported on 05/30/2022) 100 mL 0   valACYclovir (VALTREX) 1000 MG tablet Take 1 tablet (1,000 mg total) by mouth 3 (three) times daily. (Patient not taking: Reported on 05/30/2022) 21 tablet 0   No current facility-administered medications on file prior to visit.   No Known Allergies Social History   Socioeconomic History   Marital status: Married    Spouse name: Not on file   Number of children: Not on file   Years of education: Not on file   Highest education level: Not on file  Occupational History   Not on file  Tobacco Use   Smoking status: Never   Smokeless tobacco: Never  Substance and Sexual Activity   Alcohol use: No    Comment: before pregnancy   Drug use: No   Sexual activity: Yes    Birth control/protection: None, Condom  Other Topics Concern   Not on file  Social History Narrative   Married.    1 child--son-- as of 09/2013- he is 56 months old   She stays home with baby   She lived in [place] until age 36 y/o   Then lived in [place] to [place] 2014   Social Determinants of Radio broadcast assistant  Strain: Not on file  Food Insecurity: Not on file  Transportation Needs: Not on file  Physical Activity: Not on file  Stress: Not on file  Social Connections: Not on file  Intimate Partner Violence: Not on file   Family History  Problem Relation Age of Onset   Hypertension Mother    Hyperlipidemia Mother    Heart disease Father        bypass age 43   Stroke Father    Heart disease Brother        bypass age 68   Alcohol abuse Brother    Stroke Brother      Review of Systems  All other systems reviewed and are negative.      Objective:   Physical Exam Vitals reviewed.  Constitutional:      General: She is not in acute distress.    Appearance: Normal appearance. She is normal weight. She is not ill-appearing,  toxic-appearing or diaphoretic.  HENT:     Head: Normocephalic and atraumatic.     Right Ear: Tympanic membrane, ear canal and external ear normal. There is no impacted cerumen.     Left Ear: Tympanic membrane, ear canal and external ear normal. There is no impacted cerumen.     Nose: Nose normal. No congestion or rhinorrhea.     Mouth/Throat:     Mouth: Mucous membranes are moist.     Pharynx: Oropharynx is clear. No oropharyngeal exudate or posterior oropharyngeal erythema.  Eyes:     General: No scleral icterus.       Right eye: No discharge.        Left eye: No discharge.     Extraocular Movements: Extraocular movements intact.     Conjunctiva/sclera: Conjunctivae normal.     Pupils: Pupils are equal, round, and reactive to light.  Neck:     Vascular: No carotid bruit.  Cardiovascular:     Rate and Rhythm: Normal rate and regular rhythm.     Pulses: Normal pulses.     Heart sounds: Normal heart sounds. No murmur heard.    No friction rub. No gallop.  Pulmonary:     Effort: Pulmonary effort is normal. No respiratory distress.     Breath sounds: Normal breath sounds. No stridor. No wheezing, rhonchi or rales.  Chest:     Chest wall: No tenderness.  Abdominal:     General: Abdomen is flat. Bowel sounds are normal. There is no distension.     Palpations: Abdomen is soft. There is no mass.     Tenderness: There is no abdominal tenderness. There is no right CVA tenderness, left CVA tenderness, guarding or rebound.     Hernia: No hernia is present.  Musculoskeletal:     Cervical back: Normal range of motion and neck supple. No rigidity.     Right lower leg: No edema.     Left lower leg: No edema.  Lymphadenopathy:     Cervical: No cervical adenopathy.  Skin:    General: Skin is warm.     Coloration: Skin is not jaundiced or pale.     Findings: No bruising, erythema, lesion or rash.  Neurological:     General: No focal deficit present.     Mental Status: She is alert and  oriented to person, place, and time. Mental status is at baseline.     Cranial Nerves: No cranial nerve deficit.     Sensory: No sensory deficit.     Motor: No weakness.  Coordination: Coordination normal.     Gait: Gait normal.     Deep Tendon Reflexes: Reflexes normal.  Psychiatric:        Mood and Affect: Mood normal.        Behavior: Behavior normal.        Thought Content: Thought content normal.        Judgment: Judgment normal.           Assessment & Plan:  General medical exam - Plan: CBC with Differential/Platelet, COMPLETE METABOLIC PANEL WITH GFR, Lipid panel After discussing the situation, the patient believes she may be dealing with something akin to depression.  I would like to try her on Lexapro 10 mg a day for 4 to 6 weeks to see if this helps with her stress level and her anxiety and also hopefully the insomnia.  Reassess in 6 weeks or sooner if worsening mention to be sleeping well.  Offered the patient a flu shot and the COVID-vaccine

## 2022-05-31 LAB — CBC WITH DIFFERENTIAL/PLATELET
Basophils Absolute: 72 cells/uL (ref 0–200)
Basophils Relative: 1.2 %
Eosinophils Absolute: 150 cells/uL (ref 15–500)
HCT: 41.1 % (ref 35.0–45.0)
Hemoglobin: 13.8 g/dL (ref 11.7–15.5)
Lymphs Abs: 1410 cells/uL (ref 850–3900)
MCH: 30.3 pg (ref 27.0–33.0)
MCHC: 33.6 g/dL (ref 32.0–36.0)
MCV: 90.1 fL (ref 80.0–100.0)
MPV: 10.9 fL (ref 7.5–12.5)
Monocytes Relative: 7.2 %
Neutro Abs: 3936 cells/uL (ref 1500–7800)
Neutrophils Relative %: 65.6 %
Platelets: 220 10*3/uL (ref 140–400)
RBC: 4.56 10*6/uL (ref 3.80–5.10)
RDW: 12 % (ref 11.0–15.0)
Total Lymphocyte: 23.5 %

## 2022-05-31 LAB — LIPID PANEL
Cholesterol: 158 mg/dL (ref <200)
HDL: 59 mg/dL (ref >=50)
LDL Cholesterol (Calc): 86 mg/dL (calc)
Non-HDL Cholesterol (Calc): 99 mg/dL (calc) (ref <130)
Total CHOL/HDL Ratio: 2.7 (calc) (ref <5.0)
Triglycerides: 46 mg/dL (ref <150)

## 2022-05-31 LAB — COMPLETE METABOLIC PANEL WITH GFR
AG Ratio: 1.7 (calc) (ref 1.0–2.5)
ALT: 16 U/L (ref 6–29)
AST: 19 U/L (ref 10–30)
Albumin: 4.5 g/dL (ref 3.6–5.1)
Alkaline phosphatase (APISO): 70 U/L (ref 31–125)
BUN: 10 mg/dL (ref 7–25)
CO2: 28 mmol/L (ref 20–32)
Calcium: 9.8 mg/dL (ref 8.6–10.2)
Chloride: 103 mmol/L (ref 98–110)
Creat: 0.63 mg/dL (ref 0.50–0.97)
Globulin: 2.7 g/dL (calc) (ref 1.9–3.7)
Glucose, Bld: 80 mg/dL (ref 65–99)
Potassium: 4.6 mmol/L (ref 3.5–5.3)
Sodium: 138 mmol/L (ref 135–146)
Total Bilirubin: 0.6 mg/dL (ref 0.2–1.2)
Total Protein: 7.2 g/dL (ref 6.1–8.1)
eGFR: 118 mL/min/{1.73_m2} (ref >=60)

## 2022-07-01 ENCOUNTER — Other Ambulatory Visit: Payer: Self-pay | Admitting: Family Medicine

## 2022-07-01 NOTE — Telephone Encounter (Signed)
Unable to refill per protocol, Rx request is too soon.  Requested Prescriptions  Pending Prescriptions Disp Refills   escitalopram (LEXAPRO) 10 MG tablet [Pharmacy Med Name: ESCITALOPRAM 10 MG TABLET] 90 tablet 2    Sig: TAKE 1 TABLET BY MOUTH EVERY DAY     Psychiatry:  Antidepressants - SSRI Failed - 07/01/2022  2:31 PM      Failed - Valid encounter within last 6 months    Recent Outpatient Visits           1 year ago General medical exam   [facility] Susy Frizzle, MD   2 years ago General medical exam   [place] Susy Frizzle, MD   4 years ago Physical exam, annual   [facility] Dennard [facility] Cammie Mcgee, MD   4 years ago Allergic dermatitis   [facility] Orlena Sheldon, PA-C   5 years ago Encounter for preventive health examination   [facility] Orlena Sheldon, PA-C       Future Appointments             In 11 months Pickard, Cammie Mcgee, MD [place]

## 2023-06-01 ENCOUNTER — Encounter: Payer: 59 | Admitting: Family Medicine

## 2023-06-02 ENCOUNTER — Ambulatory Visit (INDEPENDENT_AMBULATORY_CARE_PROVIDER_SITE_OTHER): Payer: 59 | Admitting: Family Medicine

## 2023-06-02 ENCOUNTER — Encounter: Payer: Self-pay | Admitting: Family Medicine

## 2023-06-02 VITALS — BP 115/82 | HR 73 | Temp 97.8°F | Ht 61.0 in | Wt 117.0 lb

## 2023-06-02 DIAGNOSIS — Z13220 Encounter for screening for lipoid disorders: Secondary | ICD-10-CM | POA: Diagnosis not present

## 2023-06-02 DIAGNOSIS — Z0000 Encounter for general adult medical examination without abnormal findings: Secondary | ICD-10-CM | POA: Diagnosis not present

## 2023-06-02 LAB — LIPID PANEL
Cholesterol: 161 mg/dL (ref <200)
HDL: 60 mg/dL (ref >=50)
LDL Cholesterol (Calc): 89 mg/dL
Non-HDL Cholesterol (Calc): 101 mg/dL (ref <130)
Total CHOL/HDL Ratio: 2.7 (calc) (ref <5.0)
Triglycerides: 41 mg/dL (ref <150)

## 2023-06-02 LAB — CBC WITH DIFFERENTIAL/PLATELET
Absolute Lymphocytes: 1260 {cells}/uL (ref 850–3900)
Absolute Monocytes: 338 {cells}/uL (ref 200–950)
Basophils Absolute: 52 {cells}/uL (ref 0–200)
Basophils Relative: 1.1 %
Eosinophils Absolute: 99 {cells}/uL (ref 15–500)
Eosinophils Relative: 2.1 %
HCT: 42.3 % (ref 35.0–45.0)
Hemoglobin: 13.9 g/dL (ref 11.7–15.5)
MCH: 30 pg (ref 27.0–33.0)
MCHC: 32.9 g/dL (ref 32.0–36.0)
MCV: 91.4 fL (ref 80.0–100.0)
MPV: 11.2 fL (ref 7.5–12.5)
Monocytes Relative: 7.2 %
Neutro Abs: 2952 {cells}/uL (ref 1500–7800)
Neutrophils Relative %: 62.8 %
Platelets: 217 10*3/uL (ref 140–400)
RBC: 4.63 10*6/uL (ref 3.80–5.10)
RDW: 11.6 % (ref 11.0–15.0)
Total Lymphocyte: 26.8 %
WBC: 4.7 10*3/uL (ref 3.8–10.8)

## 2023-06-02 LAB — COMPLETE METABOLIC PANEL WITH GFR
AG Ratio: 2.1 (calc) (ref 1.0–2.5)
ALT: 13 U/L (ref 6–29)
AST: 19 U/L (ref 10–30)
Albumin: 4.8 g/dL (ref 3.6–5.1)
Alkaline phosphatase (APISO): 64 U/L (ref 31–125)
BUN: 8 mg/dL (ref 7–25)
CO2: 30 mmol/L (ref 20–32)
Calcium: 9.9 mg/dL (ref 8.6–10.2)
Chloride: 105 mmol/L (ref 98–110)
Creat: 0.75 mg/dL (ref 0.50–0.97)
Globulin: 2.3 g/dL (ref 1.9–3.7)
Glucose, Bld: 82 mg/dL (ref 65–99)
Potassium: 5.2 mmol/L (ref 3.5–5.3)
Sodium: 141 mmol/L (ref 135–146)
Total Bilirubin: 0.6 mg/dL (ref 0.2–1.2)
Total Protein: 7.1 g/dL (ref 6.1–8.1)
eGFR: 105 mL/min/{1.73_m2} (ref >=60)

## 2023-06-02 MED ORDER — HYDROXYZINE PAMOATE 25 MG PO CAPS: 25.0000 mg | ORAL_CAPSULE | Freq: Three times a day (TID) | ORAL | 1 refills | Status: AC | PRN

## 2023-06-02 NOTE — Progress Notes (Signed)
 Subjective:    Patient ID: Jo Arnold, female    DOB: 06-Mar-1986, 38 y.o.   MRN: [id number]  HPI  Patient is a very sweet 38 year old Hispanic female here today for complete physical exam.  She started taking Lexapro last year for anxiety and it did help with anxiety but it was keeping her awake at night.  She stopped the medication for that reason.  She would like a medicine that she can take sparingly occasionally to help her sleep.  She also continues to deal with anxiety but she does not want to take medication for it at the present time.  She is seeing her gynecologist for her Pap smear and breast exam next month.  Otherwise she denies any concerns. Past Medical History:  Diagnosis Date   Abnormal Pap smear    Anemia 12/2010   Hx of pyelonephritis    Hx of varicella    Medical history non-contributory    No pertinent past medical history    Vaginal Pap smear, abnormal    Past Surgical History:  Procedure Laterality Date   CESAREAN SECTION N/A 03/09/2013   Procedure: CESAREAN SECTION;  Surgeon: Jeani Hawking, MD;  Location: [facility];  Service: Obstetrics;  Laterality: N/A;   CESAREAN SECTION N/A    Phreesia 05/18/2020   NO PAST SURGERIES     Current Outpatient Medications on File Prior to Visit  Medication Sig Dispense Refill   cetirizine (ZYRTEC ALLERGY) 10 MG tablet Take 1 tablet (10 mg total) by mouth daily. (Patient not taking: Reported on 05/30/2022) 30 tablet 0   escitalopram (LEXAPRO) 10 MG tablet Take 1 tablet (10 mg total) by mouth daily. 30 tablet 5   predniSONE (DELTASONE) 10 MG tablet Take 3 tablets (30 mg total) by mouth daily with breakfast. (Patient not taking: Reported on 05/30/2022) 15 tablet 0   promethazine-dextromethorphan (PROMETHAZINE-DM) 6.25-15 MG/5ML syrup Take 2.5 mLs by mouth 3 (three) times daily as needed for cough. (Patient not taking: Reported on 05/30/2022) 100 mL 0   valACYclovir (VALTREX) 1000 MG tablet Take 1 tablet (1,000 mg total) by  mouth 3 (three) times daily. (Patient not taking: Reported on 05/30/2022) 21 tablet 0   No current facility-administered medications on file prior to visit.   No Known Allergies Social History   Socioeconomic History   Marital status: Married    Spouse name: Not on file   Number of children: Not on file   Years of education: Not on file   Highest education level: Not on file  Occupational History   Not on file  Tobacco Use   Smoking status: Never   Smokeless tobacco: Never  Substance and Sexual Activity   Alcohol use: No    Comment: before pregnancy   Drug use: No   Sexual activity: Yes    Birth control/protection: None, Condom  Other Topics Concern   Not on file  Social History Narrative   Married.    1 child--son-- as of 09/2013- he is 59 months old   She stays home with baby   She lived in [place] until age 38 y/o   Then lived in [place] to [place] 2014   Social Drivers of Health   Financial Resource Strain: Not on [employer] Insecurity: Not on file  Transportation Needs: Not on file  Physical Activity: Not on file  Stress: Not on file  Social Connections: Not on file  Intimate Partner Violence: Not on file  Family History  Problem Relation Age of Onset   Hypertension Mother    Hyperlipidemia Mother    Heart disease Father        bypass age 38   Stroke Father    Heart disease Brother        bypass age 38   Alcohol abuse Brother    Stroke Brother      Review of Systems  All other systems reviewed and are negative.      Objective:   Physical Exam Vitals reviewed.  Constitutional:      General: She is not in acute distress.    Appearance: Normal appearance. She is normal weight. She is not ill-appearing, toxic-appearing or diaphoretic.  HENT:     Head: Normocephalic and atraumatic.     Right Ear: Tympanic membrane, ear canal and external ear normal. There is no impacted cerumen.     Left Ear: Tympanic membrane, ear canal and external  ear normal. There is no impacted cerumen.     Nose: Nose normal. No congestion or rhinorrhea.     Mouth/Throat:     Mouth: Mucous membranes are moist.     Pharynx: Oropharynx is clear. No oropharyngeal exudate or posterior oropharyngeal erythema.  Eyes:     General: No scleral icterus.       Right eye: No discharge.        Left eye: No discharge.     Extraocular Movements: Extraocular movements intact.     Conjunctiva/sclera: Conjunctivae normal.     Pupils: Pupils are equal, round, and reactive to light.  Neck:     Vascular: No carotid bruit.  Cardiovascular:     Rate and Rhythm: Normal rate and regular rhythm.     Pulses: Normal pulses.     Heart sounds: Normal heart sounds. No murmur heard.    No friction rub. No gallop.  Pulmonary:     Effort: Pulmonary effort is normal. No respiratory distress.     Breath sounds: Normal breath sounds. No stridor. No wheezing, rhonchi or rales.  Chest:     Chest wall: No tenderness.  Abdominal:     General: Abdomen is flat. Bowel sounds are normal. There is no distension.     Palpations: Abdomen is soft. There is no mass.     Tenderness: There is no abdominal tenderness. There is no right CVA tenderness, left CVA tenderness, guarding or rebound.     Hernia: No hernia is present.  Musculoskeletal:     Cervical back: Normal range of motion and neck supple. No rigidity.     Right lower leg: No edema.     Left lower leg: No edema.  Lymphadenopathy:     Cervical: No cervical adenopathy.  Skin:    General: Skin is warm.     Coloration: Skin is not jaundiced or pale.     Findings: No bruising, erythema, lesion or rash.  Neurological:     General: No focal deficit present.     Mental Status: She is alert and oriented to person, place, and time. Mental status is at baseline.     Cranial Nerves: No cranial nerve deficit.     Sensory: No sensory deficit.     Motor: No weakness.     Coordination: Coordination normal.     Gait: Gait normal.      Deep Tendon Reflexes: Reflexes normal.  Psychiatric:        Mood and Affect: Mood normal.  Behavior: Behavior normal.        Thought Content: Thought content normal.        Judgment: Judgment normal.           Assessment & Plan:  General medical exam - Plan: COMPLETE METABOLIC PANEL WITH GFR, CBC with Differential/Platelet, Lipid panel  Screening cholesterol level - Plan: COMPLETE METABOLIC PANEL WITH GFR, CBC with Differential/Platelet, Lipid panel I will check a CBC a CMP and a lipid panel.  Patient is not yet due for mammograms or colonoscopies.  Defer her Pap smear and the pelvic exam to her gynecologist.  Patient can use hydroxyzine 25 mg p.o. nightly as needed insomnia.  Consider Zoloft for anxiety or depression if necessary

## 2023-07-11 ENCOUNTER — Encounter: Payer: Self-pay | Admitting: Family Medicine

## 2023-07-11 ENCOUNTER — Other Ambulatory Visit: Payer: Self-pay | Admitting: Family Medicine

## 2023-07-11 MED ORDER — SERTRALINE HCL 50 MG PO TABS: 50.0000 mg | ORAL_TABLET | Freq: Every day | ORAL | 3 refills | Status: DC

## 2023-08-02 ENCOUNTER — Other Ambulatory Visit: Payer: Self-pay | Admitting: Family Medicine

## 2023-08-03 NOTE — Telephone Encounter (Signed)
 Requesting refill. Approve if appropriate. Thank you

## 2024-05-05 IMAGING — US US BREAST*L* LIMITED INC AXILLA
1 series · 10 of 10 positions shown · non-contrast
Comparison: Mammography 08/06/2018.
COMPARISON: Mammography 08/06/2018.

Addendum:
CLINICAL DATA: 55-year-old presenting with a tender palpable lump
in the upper-outer periareolar location of the LEFT breast,
initially associated with skin erythema. She states that the lump
has decreased in size since 1 week ago when it was initially
palpable. Annual evaluation, RIGHT breast.

The patient has been given a prescription for antibiotics by her
primary provider.
EXAM:
DIGITAL DIAGNOSTIC BILATERAL MAMMOGRAM WITH TOMOSYNTHESIS AND CAD;
ULTRASOUND LEFT BREAST LIMITED
TECHNIQUE: Bilateral digital diagnostic mammography and breast tomosynthesis
was performed. The images were evaluated with computer-aided
detection.; Targeted ultrasound examination of the left breast was
performed.

[Series 1: us breast*left* limited inc axilla · 0.07mm/px · 10 of 10 slices shown]
[im 1/10]
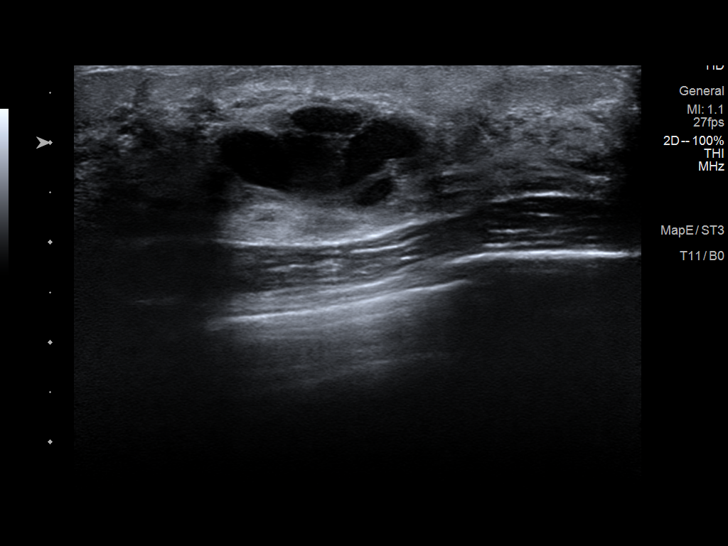
[im 2/10]
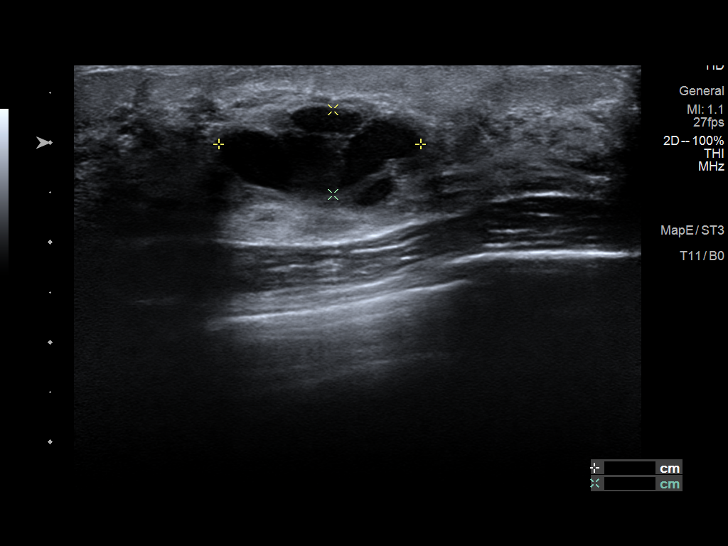
[im 3/10]
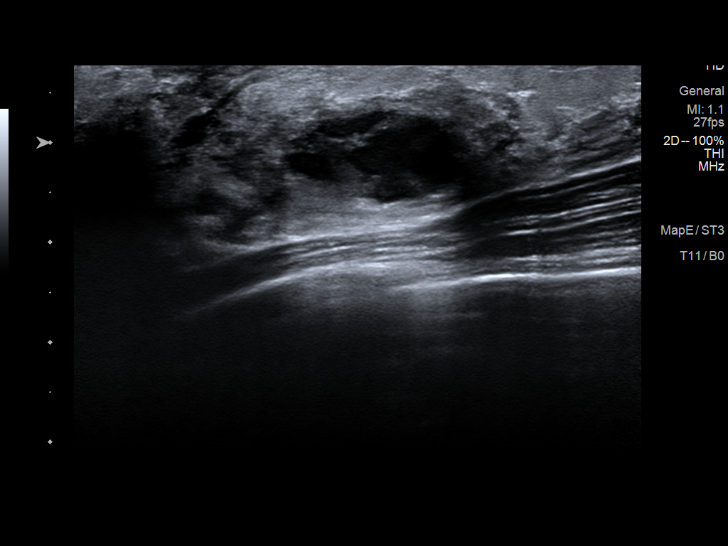
[im 4/10]
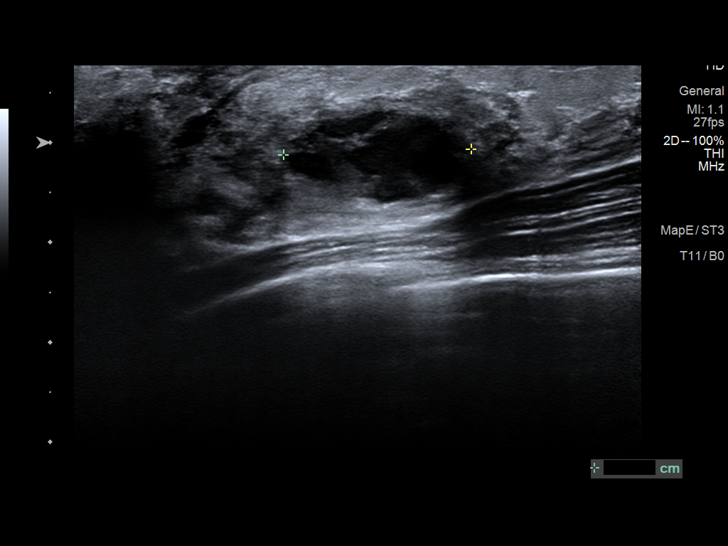
[im 5/10]
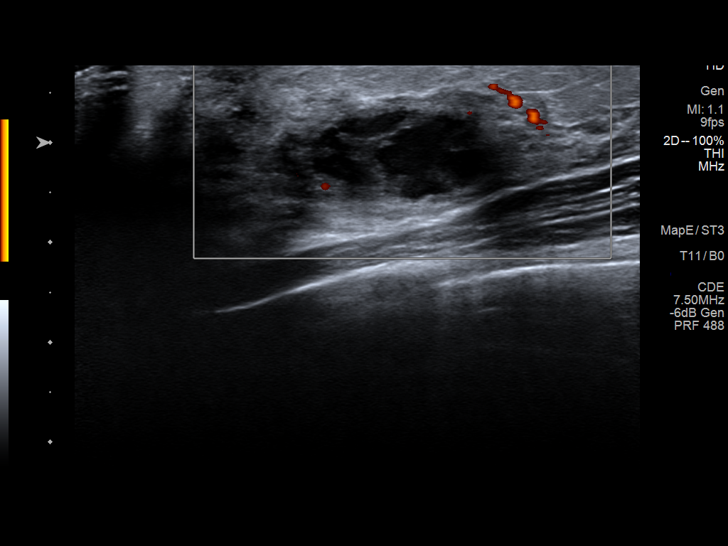
[im 6/10]
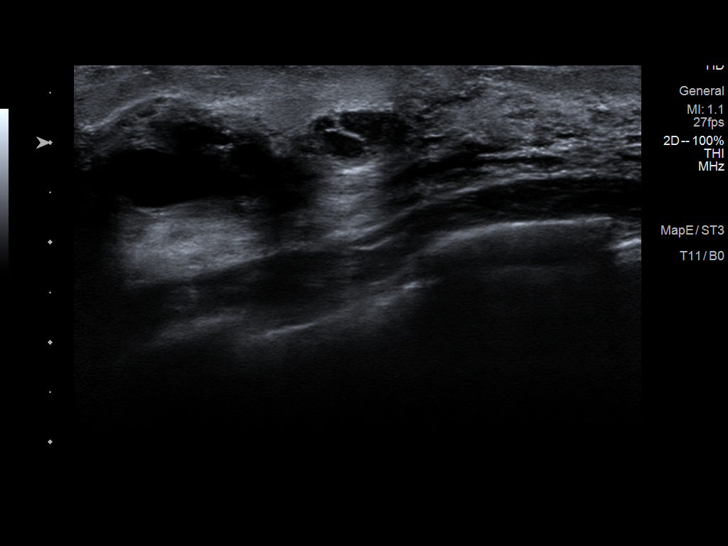
[im 7/10]
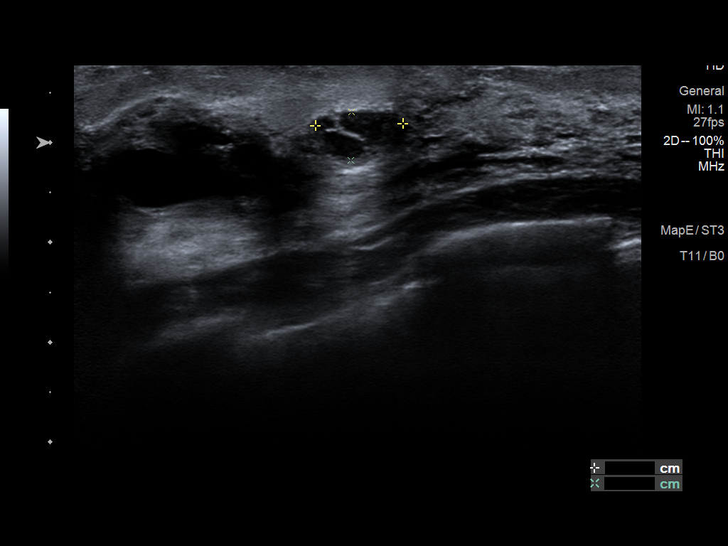
[im 8/10]
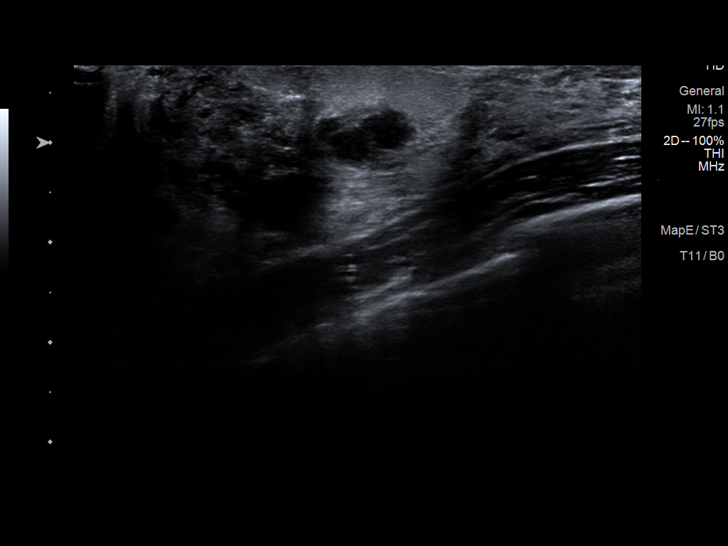
[im 9/10]
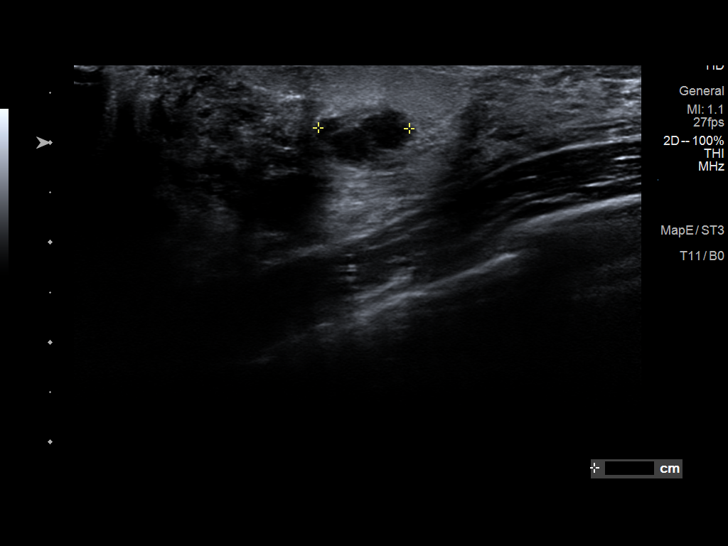
[im 10/10]
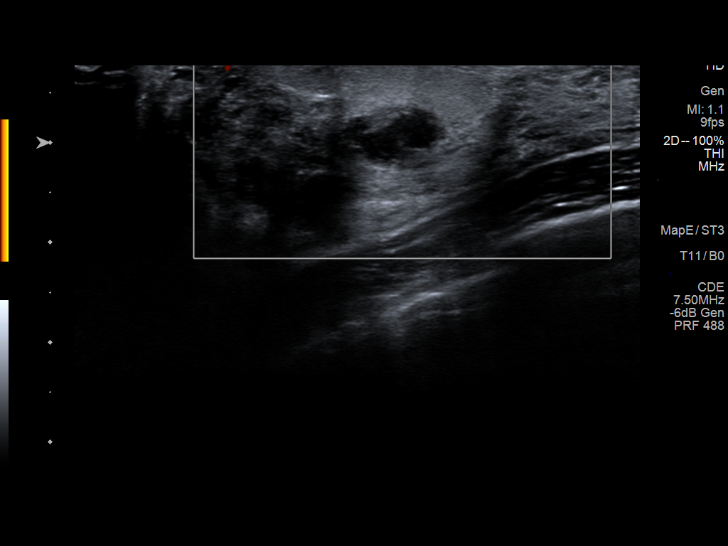

[10 of 10 positions shown; findings below may reference images not displayed]

No prior LEFT breast
ultrasound.

ACR Breast Density Category d: The breast tissue is extremely dense,
which lowers the sensitivity of mammography.
FINDINGS: Full field CC and MLO views of both breasts and a spot compression
tangential view of the palpable concern in the LEFT breast were
obtained.

RIGHT: No findings suspicious for malignancy.

LEFT: Developing asymmetry (increased density) focally in the
upper-outer periareolar location corresponding to the site of
palpable concern. No discrete mass is identified and there is no
architectural distortion or suspicious calcifications.

No new or suspicious findings elsewhere.

Targeted ultrasound is performed in the area of palpable concern,
demonstrating an anechoic mass with internal hypoechoic debris and
internal septations at the 2 o'clock position 1 cm from the nipple
measuring approximately 2.0 x 0.9 x 1.9 cm, demonstrating posterior
acoustic enhancement and no internal power Doppler flow.

There is a similar adjacent anechoic mass with internal echoes and
internal septations at the 2 o'clock position 2 cm from the nipple
measuring approximately 0.9 x 0.5 x 0.9 cm, also demonstrating
posterior acoustic enhancement and no internal power Doppler flow.
IMPRESSION: 1. Adjacent benign inflamed/infected cysts or clustered cysts in the
upper outer periareolar location of the LEFT breast at 2 o'clock.
2. No mammographic evidence of malignancy involving the RIGHT
breast.

RECOMMENDATION:
1. The patient was counseled to complete the course of antibiotic
therapy as prescribed by her primary provider.
2. She was counseled to return for follow-up ultrasound if the
palpable cysts/clustered cysts enlarge during or after antibiotic
therapy. Otherwise, no further imaging follow-up is felt necessary
and should be based on clinical findings.
3. Screening mammography should begin at age 40 unless there are
persistent or subsequent clinical concerns. (Code:[id number])

I have discussed the findings and recommendations with the patient.

BI-RADS CATEGORY  2: Benign.

ADDENDUM:
The initial sentence in the Clinical Data section should read:
35-year-old presenting with a tender palpable lump in the
upper-outer periareolar location of the LEFT breast, initially
associated with skin erythema.

*** End of Addendum ***
No prior LEFT breast
ultrasound.

ACR Breast Density Category d: The breast tissue is extremely dense,
which lowers the sensitivity of mammography.
FINDINGS: Full field CC and MLO views of both breasts and a spot compression
tangential view of the palpable concern in the LEFT breast were
obtained.

RIGHT: No findings suspicious for malignancy.

LEFT: Developing asymmetry (increased density) focally in the
upper-outer periareolar location corresponding to the site of
palpable concern. No discrete mass is identified and there is no
architectural distortion or suspicious calcifications.

No new or suspicious findings elsewhere.

Targeted ultrasound is performed in the area of palpable concern,
demonstrating an anechoic mass with internal hypoechoic debris and
internal septations at the 2 o'clock position 1 cm from the nipple
measuring approximately 2.0 x 0.9 x 1.9 cm, demonstrating posterior
acoustic enhancement and no internal power Doppler flow.

There is a similar adjacent anechoic mass with internal echoes and
internal septations at the 2 o'clock position 2 cm from the nipple
measuring approximately 0.9 x 0.5 x 0.9 cm, also demonstrating
posterior acoustic enhancement and no internal power Doppler flow.
IMPRESSION: 1. Adjacent benign inflamed/infected cysts or clustered cysts in the
upper outer periareolar location of the LEFT breast at 2 o'clock.
2. No mammographic evidence of malignancy involving the RIGHT
breast.

RECOMMENDATION:
1. The patient was counseled to complete the course of antibiotic
therapy as prescribed by her primary provider.
2. She was counseled to return for follow-up ultrasound if the
palpable cysts/clustered cysts enlarge during or after antibiotic
therapy. Otherwise, no further imaging follow-up is felt necessary
and should be based on clinical findings.
3. Screening mammography should begin at age 40 unless there are
persistent or subsequent clinical concerns. (Code:[id number])

I have discussed the findings and recommendations with the patient.

BI-RADS CATEGORY  2: Benign.
